# Patient Record
Sex: Female | Born: 1975 | Race: White | Hispanic: No | Marital: Married | Smoking: Never smoker
Health system: Southern US, Community
[De-identification: ages and names within clinical notes are randomized; demographics above are authoritative.]

## PROBLEM LIST (undated history)

## (undated) DIAGNOSIS — F410 Panic disorder [episodic paroxysmal anxiety] without agoraphobia: Secondary | ICD-10-CM

## (undated) DIAGNOSIS — F1010 Alcohol abuse, uncomplicated: Secondary | ICD-10-CM

---

## 2004-02-13 ENCOUNTER — Emergency Department: Payer: Self-pay | Admitting: Emergency Medicine

## 2004-07-28 ENCOUNTER — Emergency Department: Payer: Self-pay | Admitting: Emergency Medicine

## 2004-07-31 ENCOUNTER — Emergency Department: Payer: Self-pay | Admitting: Emergency Medicine

## 2004-09-05 ENCOUNTER — Emergency Department: Payer: Self-pay | Admitting: Emergency Medicine

## 2004-09-26 ENCOUNTER — Emergency Department: Payer: Self-pay | Admitting: Emergency Medicine

## 2005-07-14 ENCOUNTER — Emergency Department: Payer: Self-pay | Admitting: Emergency Medicine

## 2005-10-06 ENCOUNTER — Emergency Department: Payer: Self-pay | Admitting: Emergency Medicine

## 2005-11-21 ENCOUNTER — Emergency Department: Payer: Self-pay | Admitting: Unknown Physician Specialty

## 2007-01-31 ENCOUNTER — Emergency Department: Payer: Self-pay | Admitting: Emergency Medicine

## 2007-02-23 ENCOUNTER — Emergency Department: Payer: Self-pay | Admitting: Emergency Medicine

## 2007-05-03 ENCOUNTER — Emergency Department: Payer: Self-pay | Admitting: Emergency Medicine

## 2007-09-20 ENCOUNTER — Emergency Department: Payer: Self-pay | Admitting: Emergency Medicine

## 2007-12-09 ENCOUNTER — Emergency Department: Payer: Self-pay | Admitting: Emergency Medicine

## 2008-11-19 ENCOUNTER — Inpatient Hospital Stay: Payer: Self-pay | Admitting: Psychiatry

## 2011-02-06 ENCOUNTER — Emergency Department: Payer: Self-pay | Admitting: Emergency Medicine

## 2011-07-27 ENCOUNTER — Emergency Department: Payer: Self-pay | Admitting: Emergency Medicine

## 2011-07-27 LAB — URINALYSIS, COMPLETE
Protein: 100
RBC,UR: 57 /HPF (ref 0–5)
Squamous Epithelial: NONE SEEN

## 2011-07-27 LAB — PREGNANCY, URINE: Pregnancy Test, Urine: NEGATIVE m[IU]/mL

## 2012-11-23 ENCOUNTER — Inpatient Hospital Stay: Payer: Self-pay | Admitting: Psychiatry

## 2012-11-23 LAB — COMPREHENSIVE METABOLIC PANEL
Albumin: 3.9 g/dL (ref 3.4–5.0)
Anion Gap: 9 (ref 7–16)
BUN: 9 mg/dL (ref 7–18)
Bilirubin,Total: 0.3 mg/dL (ref 0.2–1.0)
Calcium, Total: 8.6 mg/dL (ref 8.5–10.1)
Co2: 22 mmol/L (ref 21–32)
Creatinine: 0.82 mg/dL (ref 0.60–1.30)
EGFR (African American): >60
EGFR (Non-African Amer.): >60
Glucose: 118 mg/dL — ABNORMAL HIGH (ref 65–99)
Osmolality: 270 (ref 275–301)
Potassium: 3.3 mmol/L — ABNORMAL LOW (ref 3.5–5.1)
SGOT(AST): 23 U/L (ref 15–37)
Total Protein: 8.1 g/dL (ref 6.4–8.2)

## 2012-11-23 LAB — ETHANOL
Ethanol %: 0.296 % — ABNORMAL HIGH (ref 0.000–0.080)
Ethanol %: 0.106 % — ABNORMAL HIGH (ref 0.000–0.080)
Ethanol: 296 mg/dL
Ethanol: 106 mg/dL

## 2012-11-23 LAB — URINALYSIS, COMPLETE
Bacteria: NONE SEEN
Bilirubin,UR: NEGATIVE
Blood: NEGATIVE
Ketone: NEGATIVE
Leukocyte Esterase: NEGATIVE
Ph: 6 (ref 4.5–8.0)
Protein: NEGATIVE
RBC,UR: <1 /HPF (ref 0–5)
WBC UR: NONE SEEN /HPF (ref 0–5)

## 2012-11-23 LAB — DRUG SCREEN, URINE
Barbiturates, Ur Screen: NEGATIVE (ref ?–200)
Cannabinoid 50 Ng, Ur ~~LOC~~: NEGATIVE (ref ?–50)
Cocaine Metabolite,Ur ~~LOC~~: NEGATIVE (ref ?–300)
MDMA (Ecstasy)Ur Screen: NEGATIVE (ref ?–500)
Methadone, Ur Screen: NEGATIVE (ref ?–300)
Opiate, Ur Screen: NEGATIVE (ref ?–300)
Tricyclic, Ur Screen: NEGATIVE (ref ?–1000)

## 2012-11-23 LAB — CBC
HCT: 32.4 % — ABNORMAL LOW (ref 35.0–47.0)
HGB: 10.5 g/dL — ABNORMAL LOW (ref 12.0–16.0)
MCHC: 32.4 g/dL (ref 32.0–36.0)
Platelet: 361 10*3/uL (ref 150–440)
WBC: 7 10*3/uL (ref 3.6–11.0)

## 2012-11-23 LAB — MAGNESIUM: Magnesium: 1.8 mg/dL

## 2012-12-04 ENCOUNTER — Emergency Department: Payer: Self-pay | Admitting: Emergency Medicine

## 2013-12-01 ENCOUNTER — Emergency Department: Payer: Self-pay | Admitting: Emergency Medicine

## 2013-12-01 LAB — URINALYSIS, COMPLETE
Bacteria: NONE SEEN
Specific Gravity: 1.011 (ref 1.003–1.030)
WBC UR: 921 /HPF (ref 0–5)
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
## 2014-09-20 DIAGNOSIS — Z3202 Encounter for pregnancy test, result negative: Secondary | ICD-10-CM | POA: Insufficient documentation

## 2014-09-20 DIAGNOSIS — F10120 Alcohol abuse with intoxication, uncomplicated: Secondary | ICD-10-CM | POA: Insufficient documentation

## 2014-09-20 DIAGNOSIS — F329 Major depressive disorder, single episode, unspecified: Secondary | ICD-10-CM | POA: Insufficient documentation

## 2014-09-20 DIAGNOSIS — Z8659 Personal history of other mental and behavioral disorders: Secondary | ICD-10-CM | POA: Insufficient documentation

## 2014-09-21 ENCOUNTER — Emergency Department
Admission: EM | Admit: 2014-09-21 | Discharge: 2014-09-21 | Disposition: A | Discharge status: Home / Self Care | Payer: Self-pay | Attending: Emergency Medicine | Admitting: Emergency Medicine

## 2014-09-21 ENCOUNTER — Emergency Department: Payer: Self-pay

## 2014-09-21 DIAGNOSIS — F1020 Alcohol dependence, uncomplicated: Secondary | ICD-10-CM | POA: Diagnosis present

## 2014-09-21 DIAGNOSIS — F329 Major depressive disorder, single episode, unspecified: Secondary | ICD-10-CM

## 2014-09-21 DIAGNOSIS — F10920 Alcohol use, unspecified with intoxication, uncomplicated: Secondary | ICD-10-CM

## 2014-09-21 DIAGNOSIS — F32A Depression, unspecified: Secondary | ICD-10-CM

## 2014-09-21 LAB — URINE DRUG SCREEN, QUALITATIVE (ARMC ONLY)
Amphetamines, Ur Screen: NOT DETECTED
Barbiturates, Ur Screen: NOT DETECTED
Benzodiazepine, Ur Scrn: NOT DETECTED
Cannabinoid 50 Ng, Ur ~~LOC~~: NOT DETECTED
Cocaine Metabolite,Ur ~~LOC~~: NOT DETECTED
MDMA (Ecstasy)Ur Screen: NOT DETECTED
Methadone Scn, Ur: NOT DETECTED
Opiate, Ur Screen: NOT DETECTED
Phencyclidine (PCP) Ur S: NOT DETECTED
Tricyclic, Ur Screen: NOT DETECTED

## 2014-09-21 LAB — COMPREHENSIVE METABOLIC PANEL
ALBUMIN: 4.7 g/dL (ref 3.5–5.0)
ALT: 13 U/L — AB (ref 14–54)
AST: 16 U/L (ref 15–41)
Alkaline Phosphatase: 41 U/L (ref 38–126)
Anion gap: 6 (ref 5–15)
BUN: 9 mg/dL (ref 6–20)
CO2: 25 mmol/L (ref 22–32)
Calcium: 9.6 mg/dL (ref 8.9–10.3)
Chloride: 111 mmol/L (ref 101–111)
Creatinine, Ser: 0.64 mg/dL (ref 0.44–1.00)
GFR calc non Af Amer: >60 mL/min (ref >60)
Glucose, Bld: 108 mg/dL — ABNORMAL HIGH (ref 65–99)
Potassium: 3.8 mmol/L (ref 3.5–5.1)
SODIUM: 142 mmol/L (ref 135–145)
Total Bilirubin: 0.3 mg/dL (ref 0.3–1.2)
Total Protein: 8.4 g/dL — ABNORMAL HIGH (ref 6.5–8.1)

## 2014-09-21 LAB — URINALYSIS COMPLETE WITH MICROSCOPIC (ARMC ONLY)
Bilirubin Urine: NEGATIVE
Glucose, UA: NEGATIVE mg/dL
Hgb urine dipstick: NEGATIVE
Ketones, ur: NEGATIVE mg/dL
Leukocytes, UA: NEGATIVE
Nitrite: NEGATIVE
Protein, ur: NEGATIVE mg/dL
RBC / HPF: NONE SEEN RBC/hpf (ref 0–5)
Specific Gravity, Urine: 1.002 — ABNORMAL LOW (ref 1.005–1.030)
WBC, UA: NONE SEEN WBC/hpf (ref 0–5)
pH: 6 (ref 5.0–8.0)

## 2014-09-21 LAB — CBC
HEMATOCRIT: 33.3 % — AB (ref 35.0–47.0)
Hemoglobin: 10.1 g/dL — ABNORMAL LOW (ref 12.0–16.0)
MCH: 21.2 pg — AB (ref 26.0–34.0)
MCHC: 30.4 g/dL — AB (ref 32.0–36.0)
MCV: 69.7 fL — ABNORMAL LOW (ref 80.0–100.0)
Platelets: 371 10*3/uL (ref 150–440)
RBC: 4.79 MIL/uL (ref 3.80–5.20)
RDW: 19.1 % — ABNORMAL HIGH (ref 11.5–14.5)
WBC: 6.5 10*3/uL (ref 3.6–11.0)

## 2014-09-21 LAB — ETHANOL: ALCOHOL ETHYL (B): 308 mg/dL — AB (ref <5)

## 2014-09-21 LAB — SALICYLATE LEVEL: Salicylate Lvl: <4 mg/dL (ref 2.8–30.0)

## 2014-09-21 LAB — POCT PREGNANCY, URINE: Preg Test, Ur: NEGATIVE

## 2014-09-21 LAB — ACETAMINOPHEN LEVEL

## 2014-09-21 MED ORDER — THIAMINE HCL 100 MG/ML IJ SOLN
Freq: Once | INTRAVENOUS | Status: AC
  Administered 2014-09-21: 04:00:00 via INTRAVENOUS
  Filled 2014-09-21: qty 1000

## 2014-09-21 MED ORDER — SODIUM CHLORIDE 0.9 % IV BOLUS (SEPSIS)
1000.0000 mL | Freq: Once | INTRAVENOUS | Status: AC
  Administered 2014-09-21: 1000 mL via INTRAVENOUS

## 2014-09-21 NOTE — ED Provider Notes (Signed)
[facility] Emergency Department Provider Note  ____________________________________________  Time seen: Approximately 3:17 AM  I have reviewed the triage vital signs and the nursing notes.   HISTORY  Chief Complaint Alcohol Problem  History limited by alcohol intoxication  HPI Shannon Duffy is a 39 y.o. female who presents under IVC per police for alcohol intoxication, homicidal ideation and suicidal ideation. Patient has a history of bipolar disorder and self-injurious behavior.Rest of history limited by patient's intoxication.   Past medical history Bipolar disorder Self injury   There are no active problems to display for this patient.   No past surgical history on file.  No current outpatient prescriptions on file.  Allergies Review of patient's allergies indicates no known allergies.  No family history on file.  Social History History  Substance Use Topics  . Smoking status: Not on file  . Smokeless tobacco: Not on file  . Alcohol Use: Not on file  Positive for EtOH use  Review of Systems Constitutional: No fever/chills Eyes: No visual changes. ENT: No sore throat. Cardiovascular: Denies chest pain. Respiratory: Denies shortness of breath. Gastrointestinal: No abdominal pain.  No nausea, no vomiting.  No diarrhea.  No constipation. Genitourinary: Negative for dysuria. Musculoskeletal: Negative for back pain. Skin: Negative for rash. Neurological: Negative for headaches, focal weakness or numbness. Psychiatric:Positive for HI/SI.  10-point ROS otherwise negative.  ____________________________________________   PHYSICAL EXAM:  VITAL SIGNS: ED Triage Vitals  Enc Vitals Group     BP 09/21/14 0006 142/93 mmHg     Pulse Rate 09/21/14 0006 100     Resp 09/21/14 0006 18     Temp 09/21/14 0006 98 F (36.7 C)     Temp Source 09/21/14 0006 Oral     SpO2 09/21/14 0006 100 %     Weight 09/21/14 0006 145 lb (65.772 kg)     Height 09/21/14 0006  (1.651 m)     Head Cir --      Peak Flow --      Pain Score --      Pain Loc --      Pain Edu? --      Excl. in GC? --     Constitutional: Sleeping, intoxicated. Eyes: Conjunctivae are normal. PERRL. EOMI. Head: Right forehead hematoma noted. Nose: No congestion/rhinnorhea. Mouth/Throat: Mucous membranes are moist.  Oropharynx non-erythematous. Neck: No stridor. No cervical spine tenderness to palpation. No step-offs or deformities palpated. Cardiovascular: Normal rate, regular rhythm. Grossly normal heart sounds.  Good peripheral circulation. Respiratory: Normal respiratory effort.  No retractions. Lungs CTAB. Gastrointestinal: Soft and nontender. No distention. No abdominal bruits. No CVA tenderness. Musculoskeletal: No lower extremity tenderness nor edema.  No joint effusions. Neurologic:  Slurred speech and language. No gross focal neurologic deficits are appreciated.  Skin:  Skin is warm, dry and intact. No rash noted. Psychiatric: Mood and affect are flat. Speech and behavior are depressed.  ____________________________________________   LABS (all labs ordered are listed, but only abnormal results are displayed)  Labs Reviewed  ACETAMINOPHEN LEVEL - Abnormal; Notable for the following:    Acetaminophen (Tylenol), Serum <10 (*)    All other components within normal limits  CBC - Abnormal; Notable for the following:    Hemoglobin 10.1 (*)    HCT 33.3 (*)    MCV 69.7 (*)    MCH 21.2 (*)    MCHC 30.4 (*)    RDW 19.1 (*)    All other components within normal limits  COMPREHENSIVE METABOLIC PANEL - Abnormal; Notable for the following:    Glucose, Bld 108 (*)    Total Protein 8.4 (*)    ALT 13 (*)    All other components within normal limits  ETHANOL - Abnormal; Notable for the following:    Alcohol, Ethyl (B) 308 (*)    All other components within normal limits  URINALYSIS COMPLETEWITH MICROSCOPIC ([facility] ONLY) - Abnormal; Notable for the  following:    Color, Urine COLORLESS (*)    APPearance CLEAR (*)    Specific Gravity, Urine 1.002 (*)    Bacteria, UA RARE (*)    Squamous Epithelial / LPF 0-5 (*)    All other components within normal limits  SALICYLATE LEVEL  URINE DRUG SCREEN, QUALITATIVE ([facility] ONLY)  POC URINE PREG, ED  POCT PREGNANCY, URINE   ____________________________________________  EKG  None ____________________________________________  RADIOLOGY  CT head without contrast interpreted per Dr. Clovis RileyMitchell: Normal brain. ____________________________________________   PROCEDURES  Procedure(s) performed: None  Critical Care performed: No  ____________________________________________   INITIAL IMPRESSION / ASSESSMENT AND PLAN / ED COURSE  Pertinent labs & imaging results that were available during my care of the patient were reviewed by me and considered in my medical decision making (see chart for details).  39 year old female who presents under IVC who is intoxicated, voicing SI/HI with a prior history of bipolar disorder and self-injurious behavior. Patient has a hematoma to her right forehead noted on exam; unknown if patient fell. Will obtain CT head, maintain IVC, IV fluid resuscitation, consult TTS and psychiatry to that patient in the emergency department.  ----------------------------------------- 7:17 AM on 09/21/2014 -----------------------------------------  Patient resting in no acute distress. Pending psychiatry evaluation this morning. ____________________________________________   FINAL CLINICAL IMPRESSION(S) / ED DIAGNOSES  Final diagnoses:  Alcohol intoxication, uncomplicated  Depression      Irean HongJade J Syesha Thaw, MD 09/21/14 [phone]

## 2014-09-21 NOTE — ED Notes (Signed)
Pt to be discharged per Dr. Jenne CampusMcQueen

## 2014-09-21 NOTE — ED Notes (Signed)
BEHAVIORAL HEALTH ROUNDING Patient sleeping: No. Patient alert and oriented: yes Behavior appropriate: Yes.  ; If no, describe:  Nutrition and fluids offered: Yes  Toileting and hygiene offered: Yes  Sitter present: no Law enforcement present: Yes  

## 2014-09-21 NOTE — ED Notes (Signed)
.  [facility] 

## 2014-09-21 NOTE — ED Notes (Signed)
Sitting up in bed watching TV 

## 2014-09-21 NOTE — ED Provider Notes (Signed)
[facility] and [facility]. Patient will follow-up with [facility] turn as needed  Arnaldo NatalPaul F Jonte Wollam, MD 09/21/14 2036

## 2014-09-21 NOTE — ED Notes (Signed)
Pt sitting up in bed watching tv. 

## 2014-09-21 NOTE — ED Notes (Signed)
BEHAVIORAL HEALTH ROUNDING  Patient sleeping: No.  Patient alert and oriented: yes  Behavior appropriate: Yes. ; If no, describe:  Nutrition and fluids offered: Yes  Toileting and hygiene offered: Yes  Sitter present: not applicable  Law enforcement present: Yes [facility]  

## 2014-09-21 NOTE — [facility] Counselor (Signed)
Pt. has been accepted to [facility]. Accepting physician is Dr. Les Pouarlton. Call report to [phone]. Representative was [employer]. Discussed with Psych MD, Dr. Jenne CampusMcQueen and states, she no longer meet criteria for inpatient and IVC. Writer called [facility] back and updated them, that the patient will not be coming to their facility.  Writer provided the pt. with information and instructions on how to access Out Pt. Mental Health Treatment ([facility] and [facility]).

## 2014-09-21 NOTE — ED Notes (Signed)
Patient sitting up watching TV.

## 2014-09-21 NOTE — Consult Note (Signed)
[facility] Consult   Reason for Consult:  Alcohol intoxication with SI yesterday.  Referring Physician:  ED Physician at [facility]  Patient Identification: Shannon Duffy MRN:  [id number] Principal Diagnosis: Alcohol use disorder, moderate, dependence Diagnosis:   Patient Active Problem List   Diagnosis Date Noted  . Alcohol use disorder, moderate, dependence [F10.20] 09/21/2014    Total Time spent with patient: 1.5 hours  Subjective:   Shannon Duffy is a 39 y.o. female patient admitted with alcohol use disorder, moderate who comes to the ED for evaluation of telling someone on the phone she wanted to harm herself when she was intoxicated last evening. The pt stated she does not recall saying this but would not be surprised if she did.   She discussed being dependent on alcohol in the past for about 10 years. She was able to stop using ethanol regularly, and abstained from ethanol use for about 7 months. This past January, the pt had one drink and then had one drink in February 2016. From there alcohol consumption increased. She is currently using ethanol 2 times per week and drinks 1-3 40 oz beers at one sitting. She shared 2 months ago, there was increased stress and due to this, alcohol consumption increased. She shared alcohol helps to decrease stress.   She noted stress includes her fiance's 54yo son who lives in the home with them.  She also shared her 3 children are with her this summer and that is causing some increased stress as well.    The pt denies SI, HI and AVH currently and is remorseful of her statements from last night. She wants to stop using ethanol and she shared she will attempt to do so. Her fiance is setting up [employer] for he and the pt.  She denies having weapons at home.  Pt denies symptoms consistent with depression, sadness, anxiety, mania & OCD. She also denies hopelessness, helplessness and worthlessness. Protective factors against  suicide include: family, no previous suicide attempts, strong support system and religion. Pt verbally contracts for safety.   Pt provided me permission to speak with her fiance Mila Merry at [phone]. He shared the pt has been under increased stress at home. He shared she started consuming alcohol again after 7 months of sobriety. The amount of alcohol is gradually increasing and the pt's fiance has been trying to get her to cut back. He notes last night she "overdid it" and made some statements she should not have made. He visited her this morning in the ED and noted she is doing much better and feels she is safe at home. He notes she is not a danger to herself or others. He is setting up [facility] and noted this will start next week. He feels safe with the pt at home.   HPI Elements:   Location:  at home. Quality:  moderate. Severity:  moderate. Timing:  24 hours. Duration:  6 months. Context:  due to increased stress.  Past Medical History: No past medical history on file. No past surgical history on file. Family History: No family history on file.  Psychiatric history in maternal and paternal family but pt is uncertain of their diagnoses.  Sister: substance abuse, currently in jail.   Social History:  History  Alcohol Use: Not on file     History  Drug Use Not on file    History   Social History  . Marital Status: Single    Spouse Name:  N/A  . Number of Children: N/A  . Years of Education: N/A   Social History Main Topics  . Smoking status: Not on file  . Smokeless tobacco: Not on file  . Alcohol Use: Not on file  . Drug Use: Not on file  . Sexual Activity: Not on file   Other Topics Concern  . Not on file   Social History Narrative  . No narrative on file   Pt denies having a psychiatrist currently.  Has not attended AA or other substance abuse treatment.  Has tried Zoloft in the past.   Additional Social History: Lives with her fiance, his 62yo  son and her 3 children for the summer.   Allergies:  No Known Allergies  Labs:  Results for orders placed or performed during the hospital encounter of 09/21/14 (from the past 48 hour(s))  Acetaminophen level     Status: Abnormal   Collection Time: 09/21/14 12:17 AM  Result Value Ref Range   Acetaminophen (Tylenol), Serum <10 (L) 10 - 30 ug/mL    Comment:        THERAPEUTIC CONCENTRATIONS VARY SIGNIFICANTLY. A RANGE OF 10-30 ug/mL MAY BE AN EFFECTIVE CONCENTRATION FOR MANY PATIENTS. HOWEVER, SOME ARE BEST TREATED AT CONCENTRATIONS OUTSIDE THIS RANGE. ACETAMINOPHEN CONCENTRATIONS >150 ug/mL AT 4 HOURS AFTER INGESTION AND >50 ug/mL AT 12 HOURS AFTER INGESTION ARE OFTEN ASSOCIATED WITH TOXIC REACTIONS.   CBC     Status: Abnormal   Collection Time: 09/21/14 12:17 AM  Result Value Ref Range   WBC 6.5 3.6 - 11.0 K/uL   RBC 4.79 3.80 - 5.20 MIL/uL   Hemoglobin 10.1 (L) 12.0 - 16.0 g/dL   HCT 33.3 (L) 35.0 - 47.0 %   MCV 69.7 (L) 80.0 - 100.0 fL   MCH 21.2 (L) 26.0 - 34.0 pg   MCHC 30.4 (L) 32.0 - 36.0 g/dL   RDW 19.1 (H) 11.5 - 14.5 %   Platelets 371 150 - 440 K/uL  Comprehensive metabolic panel     Status: Abnormal   Collection Time: 09/21/14 12:17 AM  Result Value Ref Range   Sodium 142 135 - 145 mmol/L   Potassium 3.8 3.5 - 5.1 mmol/L   Chloride 111 101 - 111 mmol/L   CO2 25 22 - 32 mmol/L   Glucose, Bld 108 (H) 65 - 99 mg/dL   BUN 9 6 - 20 mg/dL   Creatinine, Ser 0.64 0.44 - 1.00 mg/dL   Calcium 9.6 8.9 - 10.3 mg/dL   Total Protein 8.4 (H) 6.5 - 8.1 g/dL   Albumin 4.7 3.5 - 5.0 g/dL   AST 16 15 - 41 U/L   ALT 13 (L) 14 - 54 U/L   Alkaline Phosphatase 41 38 - 126 U/L   Total Bilirubin 0.3 0.3 - 1.2 mg/dL   GFR calc non Af Amer >60 >60 mL/min   GFR calc Af Amer >60 >60 mL/min    Comment: (NOTE) The eGFR has been calculated using the CKD EPI equation. This calculation has not been validated in all clinical situations. eGFR's persistently <60 mL/min signify  possible Chronic Kidney Disease.    Anion gap 6 5 - 15  Ethanol (ETOH)     Status: Abnormal   Collection Time: 09/21/14 12:17 AM  Result Value Ref Range   Alcohol, Ethyl (B) 308 (HH) <5 mg/dL    Comment: RESULTS VERIFIED BY REPEAT TESTING CRITICAL RESULT CALLED TO, READ BACK BY AND VERIFIED WITH APRIL BRUMGARD AT 0106 09/21/14.PMH  LOWEST DETECTABLE LIMIT FOR SERUM ALCOHOL IS 5 mg/dL FOR MEDICAL PURPOSES ONLY   Salicylate level     Status: None   Collection Time: 09/21/14 12:17 AM  Result Value Ref Range   Salicylate Lvl <2.7 2.8 - 30.0 mg/dL  Urinalysis complete, with microscopic ([facility] only)     Status: Abnormal   Collection Time: 09/21/14 12:18 AM  Result Value Ref Range   Color, Urine COLORLESS (A) YELLOW   APPearance CLEAR (A) CLEAR   Glucose, UA NEGATIVE NEGATIVE mg/dL   Bilirubin Urine NEGATIVE NEGATIVE   Ketones, ur NEGATIVE NEGATIVE mg/dL   Specific Gravity, Urine 1.002 (L) 1.005 - 1.030   Hgb urine dipstick NEGATIVE NEGATIVE   pH 6.0 5.0 - 8.0   Protein, ur NEGATIVE NEGATIVE mg/dL   Nitrite NEGATIVE NEGATIVE   Leukocytes, UA NEGATIVE NEGATIVE   RBC / HPF NONE SEEN 0 - 5 RBC/hpf   WBC, UA NONE SEEN 0 - 5 WBC/hpf   Bacteria, UA RARE (A) NONE SEEN   Squamous Epithelial / LPF 0-5 (A) NONE SEEN  Urine Drug Screen, Qualitative ([facility] only)     Status: None   Collection Time: 09/21/14 12:18 AM  Result Value Ref Range   Tricyclic, Ur Screen NONE DETECTED NONE DETECTED   Amphetamines, Ur Screen NONE DETECTED NONE DETECTED   MDMA (Ecstasy)Ur Screen NONE DETECTED NONE DETECTED   Cocaine Metabolite,Ur [place] NONE DETECTED NONE DETECTED   Opiate, Ur Screen NONE DETECTED NONE DETECTED   Phencyclidine (PCP) Ur S NONE DETECTED NONE DETECTED   Cannabinoid 50 Ng, Ur [place] NONE DETECTED NONE DETECTED   Barbiturates, Ur Screen NONE DETECTED NONE DETECTED   Benzodiazepine, Ur Scrn NONE DETECTED NONE DETECTED   Methadone Scn, Ur NONE DETECTED NONE DETECTED    Comment: (NOTE) 782   Tricyclics, urine               Cutoff 1000 ng/mL 200  Amphetamines, urine             Cutoff 1000 ng/mL 300  MDMA (Ecstasy), urine           Cutoff 500 ng/mL 400  Cocaine Metabolite, urine       Cutoff 300 ng/mL 500  Opiate, urine                   Cutoff 300 ng/mL 600  Phencyclidine (PCP), urine      Cutoff 25 ng/mL 700  Cannabinoid, urine              Cutoff 50 ng/mL 800  Barbiturates, urine             Cutoff 200 ng/mL 900  Benzodiazepine, urine           Cutoff 200 ng/mL 1000 Methadone, urine                Cutoff 300 ng/mL 1100 1200 The urine drug screen provides only a preliminary, unconfirmed 1300 analytical test result and should not be used for non-medical 1400 purposes. Clinical consideration and professional judgment should 1500 be applied to any positive drug screen result due to possible 1600 interfering substances. A more specific alternate chemical method 1700 must be used in order to obtain a confirmed analytical result.  1800 Gas chromato graphy / mass spectrometry (GC/MS) is the preferred 1900 confirmatory method.   Pregnancy, urine POC     Status: None   Collection Time: 09/21/14 12:33 AM  Result Value Ref Range   Preg Test, Ur  NEGATIVE NEGATIVE    Comment:        THE SENSITIVITY OF THIS METHODOLOGY IS >24 mIU/mL     Vitals: Blood pressure 115/71, pulse 79, temperature 98.6 F (37 C), temperature source Oral, resp. rate 20, height _0  (1.651 m), weight 65.772 kg (145 lb), last menstrual period 08/29/2014, SpO2 95 %.  Risk to Self: Suicidal Ideation: Yes-Currently Present Suicidal Intent: Yes-Currently Present Is patient at risk for suicide?: Yes Suicidal Plan?: Yes-Currently Present Access to Means: No What has been your use of drugs/alcohol within the last 12 months?: alcohol; drank 3--40oz beers; BAC: 308 How many times?: 3 Other Self Harm Risks: h/o cutting Triggers for Past Attempts: Spouse contact, Family contact Intentional Self Injurious  Behavior: Cutting Comment - Self Injurious Behavior: per friend; "she has been cutting for a long time." Risk to Others: Homicidal Ideation: No Thoughts of Harm to Others: No Current Homicidal Intent: No Current Homicidal Plan: No Access to Homicidal Means: No Identified Victim: none History of harm to others?: No Assessment of Violence: On admission Violent Behavior Description: aggressive when intoxicated Does patient have access to weapons?: No Criminal Charges Pending?: No Does patient have a court date: No Prior Inpatient Therapy: Prior Inpatient Therapy: Yes Prior Therapy Dates: unknown Prior Therapy Facilty/Provider(s): [facility] Reason for Treatment: depression; alcohol Prior Outpatient Therapy: Prior Outpatient Therapy: No Does patient have an ACCT team?: No Does patient have Intensive In-House Services?  : No Does patient have [facility] services? : No Does patient have [facility] services?: No  No current facility-administered medications for this encounter.   No current outpatient prescriptions on file.    Musculoskeletal: Strength & Muscle Tone: within normal limits Gait & Station: not assessed Patient leans: N/A  Psychiatric Specialty Exam: Physical Exam  Review of Systems  Constitutional: Negative.   HENT: Negative.   Eyes: Negative.   Respiratory: Negative.   Cardiovascular: Negative.   Gastrointestinal: Negative.   Genitourinary: Negative.   Musculoskeletal: Negative.   Skin: Negative.   Neurological: Negative.   Endo/Heme/Allergies: Negative.   Psychiatric/Behavioral: Negative.     Blood pressure 115/71, pulse 79, temperature 98.6 F (37 C), temperature source Oral, resp. rate 20, height _1  (1.651 m), weight 65.772 kg (145 lb), last menstrual period 08/29/2014, SpO2 95 %.Body mass index is 24.13 kg/(m^2).  General Appearance: Casual, Neat and Well Groomed  Eye Contact::  Good  Speech:  Clear and Coherent  Volume:  Normal  Mood:  Euthymic  Affect:   Appropriate  Thought Process:  Coherent, Goal Directed, Intact, Linear and Logical  Orientation:  Full (Time, Place, and Person)  Thought Content:  Negative  Suicidal Thoughts:  No  Homicidal Thoughts:  No  Memory:  Immediate;   Good Recent;   Fair Remote;   Good  Judgement:  Fair  Insight:  Fair  Psychomotor Activity:  Normal  Concentration:  Good  Recall:  Good  Fund of Knowledge:Good  Language: Good  Akathisia:  NA  Handed:  Right  AIMS (if indicated):     Assets:  Communication Skills Desire for Improvement Financial Resources/Insurance Housing Intimacy Physical Health Resilience Social Support Talents/Skills Transportation Vocational/Educational  ADL's:  Intact  Cognition: WNL  Sleep:      Medical Decision Making: Review or order clinical lab tests (1) and Established Problem, Worsening (2)  Treatment Plan Summary: 1. Discharge home.  2. No medication changes necessary at this time.  3. Pt will follow-up with [facility] as an output.  4. Recommended outpatient  substance abuse treatment including AA.  5. IVC discontinued.   Plan:  No evidence of imminent risk to self or others at present.   Patient does not meet criteria for psychiatric inpatient admission. Supportive therapy provided about ongoing stressors. Discussed crisis plan, support from social network, calling 911, coming to the Emergency Department, and calling Suicide Hotline. Disposition: Home  Donita Brooks, M.D. Attending Physician [place] 09/21/2014 5:01 PM

## 2014-09-21 NOTE — ED Notes (Signed)
Patient eating and watching tv

## 2014-09-21 NOTE — ED Notes (Signed)
Pt brought into ED BHU via sally port and wanded with metal detector for safety by [employer] officer and ED Alanda Amassech Toni. Patient oriented to unit/care area: Pt informed of unit policies and procedures.  Informed that, for their safety, care areas are designed for safety and monitored by security cameras at all times; and visiting hours explained to patient. Patient verbalizes understanding, and verbal contract for safety obtained.Pt shown to her room.   ENVIRONMENTAL ASSESSMENT Potentially harmful objects out of patient reach: Yes.   Personal belongings secured: Yes.   Patient dressed in hospital provided attire only: Yes.   Plastic bags out of patient reach: Yes.   Patient care equipment  removed: Yes.   Equipment and supplies removed: Yes.   Potentially toxic materials out of patient reach: Yes.   Sharps container removed or out of patient reach: Yes.    BEHAVIORAL HEALTH ROUNDING Patient sleeping: No. Patient alert and oriented: yes Behavior appropriate: Yes.   Nutrition and fluids offered: Yes  Toileting and hygiene offered: Yes  Sitter present: q15 min observations and security camera monitoring Law enforcement present: Yes [employer]  Patient assigned to appropriate care area. Patient oriented to unit/care area: Informed that, for their safety, care areas are designed for safety and monitored by security cameras at all times; and visiting hours explained to patient. Patient verbalizes understanding, and verbal contract for safety obtained.

## 2014-09-21 NOTE — Discharge Instructions (Signed)
Alcohol Use Disorder °Alcohol use disorder is a mental disorder. It is not a one-time incident of heavy drinking. Alcohol use disorder is the excessive and uncontrollable use of alcohol over time that leads to problems with functioning in one or more areas of daily living. People with this disorder risk harming themselves and others when they drink to excess. Alcohol use disorder also can cause other mental disorders, such as mood and anxiety disorders, and serious physical problems. People with alcohol use disorder often misuse other drugs.  °Alcohol use disorder is common and widespread. Some people with this disorder drink alcohol to cope with or escape from negative life events. Others drink to relieve chronic pain or symptoms of mental illness. People with a family history of alcohol use disorder are at higher risk of losing control and using alcohol to excess.  °SYMPTOMS  °Signs and symptoms of alcohol use disorder may include the following:  °· Consumption of alcohol in larger amounts or over a longer period of time than intended. °· Multiple unsuccessful attempts to cut down or control alcohol use.   °· A great deal of time spent obtaining alcohol, using alcohol, or recovering from the effects of alcohol (hangover). °· A strong desire or urge to use alcohol (cravings).   °· Continued use of alcohol despite problems at work, school, or home because of alcohol use.   °· Continued use of alcohol despite problems in relationships because of alcohol use. °· Continued use of alcohol in situations when it is physically hazardous, such as driving a car. °· Continued use of alcohol despite awareness of a physical or psychological problem that is likely related to alcohol use. Physical problems related to alcohol use can involve the brain, heart, liver, stomach, and intestines. Psychological problems related to alcohol use include intoxication, depression, anxiety, psychosis, delirium, and dementia.   °· The need for  increased amounts of alcohol to achieve the same desired effect, or a decreased effect from the consumption of the same amount of alcohol (tolerance). °· Withdrawal symptoms upon reducing or stopping alcohol use, or alcohol use to reduce or avoid withdrawal symptoms. Withdrawal symptoms include: °¨ Racing heart. °¨ Hand tremor. °¨ Difficulty sleeping. °¨ Nausea. °¨ Vomiting. °¨ Hallucinations. °¨ Restlessness. °¨ Seizures. °DIAGNOSIS °Alcohol use disorder is diagnosed through an assessment by your health care provider. Your health care provider may start by asking three or four questions to screen for excessive or problematic alcohol use. To confirm a diagnosis of alcohol use disorder, at least two symptoms must be present within a 12-month period. The severity of alcohol use disorder depends on the number of symptoms: °· Mild--two or three. °· Moderate--four or five. °· Severe--six or more. °Your health care provider may perform a physical exam or use results from lab tests to see if you have physical problems resulting from alcohol use. Your health care provider may refer you to a mental health professional for evaluation. °TREATMENT  °Some people with alcohol use disorder are able to reduce their alcohol use to low-risk levels. Some people with alcohol use disorder need to quit drinking alcohol. When necessary, mental health professionals with specialized training in substance use treatment can help. Your health care provider can help you decide how severe your alcohol use disorder is and what type of treatment you need. The following forms of treatment are available:  °· Detoxification. Detoxification involves the use of prescription medicines to prevent alcohol withdrawal symptoms in the first week after quitting. This is important for people with a history of symptoms   of withdrawal and for heavy drinkers who are likely to have withdrawal symptoms. Alcohol withdrawal can be dangerous and, in severe cases, cause  death. Detoxification is usually provided in a hospital or in-patient substance use treatment facility.  Counseling or talk therapy. Talk therapy is provided by substance use treatment counselors. It addresses the reasons people use alcohol and ways to keep them from drinking again. The goals of talk therapy are to help people with alcohol use disorder find healthy activities and ways to cope with life stress, to identify and avoid triggers for alcohol use, and to handle cravings, which can cause relapse.  Medicines.Different medicines can help treat alcohol use disorder through the following actions:  Decrease alcohol cravings.  Decrease the positive reward response felt from alcohol use.  Produce an uncomfortable physical reaction when alcohol is used (aversion therapy).  Support groups. Support groups are run by people who have quit drinking. They provide emotional support, advice, and guidance. These forms of treatment are often combined. Some people with alcohol use disorder benefit from intensive combination treatment provided by specialized substance use treatment centers. Both inpatient and outpatient treatment programs are available. Document Released: 04/15/2004 Document Revised: 07/23/2013 Document Reviewed: 06/15/2012 [facility] Patient Information 2015 [employer], [place]. This information is not intended to replace advice given to you by your health care provider. Make sure you discuss any questions you have with your health care provider.   Follow up with [facility] instructions on the other sheet of paper.   Return as needed.
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
## 2014-09-21 NOTE — ED Notes (Signed)
Patient transported to CT 

## 2014-09-21 NOTE — ED Notes (Signed)
Patient laying in bed, took in dinner sat up to eat and ask the time.

## 2014-09-21 NOTE — ED Notes (Signed)
Sitting up watching TV 

## 2014-09-21 NOTE — [facility] Counselor (Signed)
Referral Information [facility] to [facility] [phone]) for possible placement in their dual dx program.

## 2014-09-21 NOTE — ED Notes (Signed)

## 2014-09-21 NOTE — ED Notes (Signed)
BEHAVIORAL HEALTH ROUNDING Patient sleeping: Yes.   Patient alert and oriented: not applicable Behavior appropriate: Yes.  ; If no, describe:   Nutrition and fluids offered: No Toileting and hygiene offered: No Sitter present: no Law enforcement present: Yes  and ODS  ENVIRONMENTAL ASSESSMENT Potentially harmful objects out of patient reach: Yes.   Personal belongings secured: Yes.   Patient dressed in hospital provided attire only: Yes.   Plastic bags out of patient reach: Yes.   Patient care equipment (cords, cables, call bells, lines, and drains) shortened, removed, or accounted for: Yes.   Equipment and supplies removed from bottom of stretcher: Yes.   Potentially toxic materials out of patient reach: Yes.   Sharps container removed or out of patient reach: Yes.    

## 2014-09-21 NOTE — ED Notes (Addendum)
Pt accepted at [facility] per calvin

## 2014-09-21 NOTE — ED Notes (Signed)
Fiance left patient (visiting hours over).

## 2014-09-21 NOTE — ED Notes (Signed)
Per police present, pt called mobile crisis. Pt states "i have a problem with alcohol". Pt states "i feel like i'm gonna hurt myself all the time", "i feel like i'm gonna kill someone, if i don't kill someone, i'm gonna kill myself first".

## 2014-09-21 NOTE — ED Notes (Signed)
Fiance present to visit patient.

## 2014-09-21 NOTE — ED Notes (Signed)
Resting watching TV

## 2014-09-21 NOTE — ED Notes (Signed)
Critical etoh called from lab of 304, dr. Dolores FrameSung notified, charge rn notified.

## 2014-09-21 NOTE — ED Notes (Signed)
BEHAVIORAL HEALTH ROUNDING Patient sleeping: Yes.   Patient alert and oriented: no Behavior appropriate: Yes.  ; If no, describe:   Nutrition and fluids offered: No Toileting and hygiene offered: No Sitter present: no Law enforcement present: Yes  and ODS 

## 2014-09-21 NOTE — ED Notes (Signed)
Patient transported back to room.

## 2014-09-21 NOTE — ED Notes (Signed)
BEHAVIORAL HEALTH ROUNDING Patient sleeping: Yes.   Patient alert and oriented: not applicable Behavior appropriate: Yes.  ; If no, describe:   Nutrition and fluids offered: No Toileting and hygiene offered: No Sitter present: no Law enforcement present: Yes  and ODS    

## 2014-09-21 NOTE — ED Notes (Signed)
BEHAVIORAL HEALTH ROUNDING Patient sleeping: No. Patient alert and oriented: yes Behavior appropriate: Yes.  ; If no, describe:  Nutrition and fluids offered: No Toileting and hygiene offered: Yes  Sitter present: yes Law enforcement present: Yes  

## 2014-09-21 NOTE — ED Notes (Signed)
Patient resting watching TV.

## 2014-09-21 NOTE — ED Notes (Signed)
Patient retrieved remote to change TV. Watching TV and resting

## 2014-09-21 NOTE — ED Notes (Signed)
IVC 

## 2014-12-05 ENCOUNTER — Encounter: Payer: Self-pay | Admitting: Emergency Medicine

## 2014-12-05 ENCOUNTER — Emergency Department
Admission: EM | Admit: 2014-12-05 | Discharge: 2014-12-05 | Disposition: A | Discharge status: Home / Self Care | Payer: Self-pay | Attending: Emergency Medicine | Admitting: Emergency Medicine

## 2014-12-05 ENCOUNTER — Emergency Department: Payer: Self-pay

## 2014-12-05 DIAGNOSIS — F419 Anxiety disorder, unspecified: Secondary | ICD-10-CM

## 2014-12-05 DIAGNOSIS — R079 Chest pain, unspecified: Secondary | ICD-10-CM

## 2014-12-05 DIAGNOSIS — F329 Major depressive disorder, single episode, unspecified: Secondary | ICD-10-CM | POA: Insufficient documentation

## 2014-12-05 HISTORY — DX: Panic disorder (episodic paroxysmal anxiety): F41.0

## 2014-12-05 LAB — BASIC METABOLIC PANEL
ANION GAP: 7 (ref 5–15)
BUN: 17 mg/dL (ref 6–20)
CHLORIDE: 105 mmol/L (ref 101–111)
CO2: 24 mmol/L (ref 22–32)
Calcium: 9.4 mg/dL (ref 8.9–10.3)
Creatinine, Ser: 0.59 mg/dL (ref 0.44–1.00)
GFR calc Af Amer: >60 mL/min (ref >60)
GLUCOSE: 101 mg/dL — AB (ref 65–99)
POTASSIUM: 4.1 mmol/L (ref 3.5–5.1)
Sodium: 136 mmol/L (ref 135–145)

## 2014-12-05 LAB — CBC
HEMATOCRIT: 29.8 % — AB (ref 35.0–47.0)
HEMOGLOBIN: 9 g/dL — AB (ref 12.0–16.0)
MCH: 20.9 pg — ABNORMAL LOW (ref 26.0–34.0)
MCHC: 30.3 g/dL — AB (ref 32.0–36.0)
MCV: 69 fL — AB (ref 80.0–100.0)
Platelets: 308 10*3/uL (ref 150–440)
RBC: 4.32 MIL/uL (ref 3.80–5.20)
RDW: 18.4 % — ABNORMAL HIGH (ref 11.5–14.5)
WBC: 6.6 10*3/uL (ref 3.6–11.0)

## 2014-12-05 LAB — TROPONIN I: Troponin I: <0.03 ng/mL (ref <0.031)

## 2014-12-05 NOTE — ED Provider Notes (Signed)
[facility] Emergency Department Provider Note  ____________________________________________  Time seen: 8:20 PM  I have reviewed the triage vital signs and the nursing notes.   HISTORY  Chief Complaint Chest Pain    HPI Shannon Duffy is a 39 y.o. female who complains of midsternal chest pressure and palpitations since 1:00 PM today. It was gradual in onset. She has a history of bipolar disease, PTSD, and alcoholism. The alcoholism has been in remission for 1.5 years, however over the past few months she is started drinking occasionally again. She reports that motion drinks 2 beers once a week. She also reports increased stress recently and feeling very anxious, and her spouse is working a lot so she has a lot of time alone by herself. As she started to develop some palpitations and anxiety and chest pressure, she had noted talk to and was alone with her thoughts and felt like it just snowballed. The chest pain is nonradiating, nonexertional, not pleuritic. No associated nausea vomiting or diaphoresis. No shortness of breath. Constant for the past 7 hours     Past Medical History  Diagnosis Date  . Panic      Patient Active Problem List   Diagnosis Date Noted  . Alcohol use disorder, moderate, dependence 09/21/2014     No past surgical history on file. Bilateral tubal ligation  No current outpatient prescriptions on file. None. Previously took Zoloft but has weaned herself off of it.  Allergies Review of patient's allergies indicates no known allergies.   No family history on file.  Social History Social History  Substance Use Topics  . Smoking status: Never Smoker   . Smokeless tobacco: None  . Alcohol Use: Yes    Review of Systems  Constitutional:   No fever or chills. No weight changes Eyes:   No blurry vision or double vision.  ENT:   No sore throat. Cardiovascular:   Positive as abovechest pain. Respiratory:   No dyspnea or  cough. Gastrointestinal:   Negative for abdominal pain, vomiting and diarrhea.  No BRBPR or melena. Genitourinary:   Negative for dysuria, urinary retention, bloody urine, or difficulty urinating. Musculoskeletal:   Negative for back pain. No joint swelling or pain. Skin:   Negative for rash. Neurological:   Negative for headaches, focal weakness or numbness. Psychiatric:  Positive anxiety.rapid mood swings. No SI or HI or hallucinations.   Endocrine:  No hot/cold intolerance, changes in energy, or sleep difficulty.  10-point ROS otherwise negative.  ____________________________________________   PHYSICAL EXAM:  VITAL SIGNS: ED Triage Vitals  Enc Vitals Group     BP 12/05/14 1731 151/100 mmHg     Pulse Rate 12/05/14 1731 85     Resp 12/05/14 1731 20     Temp 12/05/14 1731 97.6 F (36.4 C)     Temp Source 12/05/14 1731 Oral     SpO2 12/05/14 1731 99 %     Weight 12/05/14 1731 148 lb (67.132 kg)     Height 12/05/14 1731 5\' 6"  (1.676 m)     Head Cir --      Peak Flow --      Pain Score 12/05/14 1733 5     Pain Loc --      Pain Edu? --      Excl. in GC? --      Constitutional:   Alert and oriented. Well appearing and in no distress. Eyes:   No scleral icterus. No conjunctival pallor. PERRL. EOMI ENT  Head:   Normocephalic and atraumatic.   Nose:   No congestion/rhinnorhea. No septal hematoma   Mouth/Throat:   MMM, no pharyngeal erythema. No peritonsillar mass. No uvula shift.   Neck:   No stridor. No SubQ emphysema. No meningismus. Hematological/Lymphatic/Immunilogical:   No cervical lymphadenopathy. Cardiovascular:   RRR. Normal and symmetric distal pulses are present in all extremities. No murmurs, rubs, or gallops. Respiratory:   Normal respiratory effort without tachypnea nor retractions. Breath sounds are clear and equal bilaterally. No wheezes/rales/rhonchi. Gastrointestinal:   Soft and nontender. No distention. There is no CVA tenderness.  No rebound,  rigidity, or guarding. Genitourinary:   deferred Musculoskeletal:   Nontender with normal range of motion in all extremities. No joint effusions.  No lower extremity tenderness.  No edema. Neurologic:   Normal speech and language.  CN 2-10 normal. Motor grossly intact. No pronator drift.  Normal gait. No gross focal neurologic deficits are appreciated.  Skin:    Skin is warm, dry and intact. No rash noted.  No petechiae, purpura, or bullae. Psychiatric:   Somewhat depressed affect with occasional tearfulness when discussing the stress in her life but rapidly recovers. Speech and behavior are normal. Patient exhibits appropriate insight and judgment.  ____________________________________________    LABS (pertinent positives/negatives) (all labs ordered are listed, but only abnormal results are displayed) Labs Reviewed  BASIC METABOLIC PANEL - Abnormal; Notable for the following:    Glucose, Bld 101 (*)    All other components within normal limits  CBC - Abnormal; Notable for the following:    Hemoglobin 9.0 (*)    HCT 29.8 (*)    MCV 69.0 (*)    MCH 20.9 (*)    MCHC 30.3 (*)    RDW 18.4 (*)    All other components within normal limits  TROPONIN I   ____________________________________________   EKG  Interpreted by me Normal sinus rhythm rate of 86, normal axis intervals QRS and ST segments and T waves.  ____________________________________________    RADIOLOGY  Chest x-ray unremarkable  ____________________________________________   PROCEDURES   ____________________________________________   INITIAL IMPRESSION / ASSESSMENT AND PLAN / ED COURSE  Pertinent labs & imaging results that were available during my care of the patient were reviewed by me and considered in my medical decision making (see chart for details).  Labs unremarkable. Initial blood draw was more than 4 hours after onset of symptoms, so I think the negative troponin is more than sufficient  especially given her medical history and the atypical nature of this chest pain. Over very low suspicion for ACS PE TAD pneumothorax carditis pneumonia or sepsis. This appears to be anxiety related. She followed up with [facility] in the past and I encouraged her to do so again. I also encouraged her to the very cautious with resuming alcohol as she  Acknowledges that she is an alcoholic and is at risk for relapse.     ____________________________________________   FINAL CLINICAL IMPRESSION(S) / ED DIAGNOSES  Final diagnoses:  Chest pain, unspecified chest pain type  Anxiety      Sharman Cheek, MD 12/05/14 2054

## 2014-12-05 NOTE — Discharge Instructions (Signed)
Chest Pain (Nonspecific) °It is often hard to give a specific diagnosis for the cause of chest pain. There is always a chance that your pain could be related to something serious, such as a heart attack or a blood clot in the lungs. You need to follow up with your health care provider for further evaluation. °CAUSES  °· Heartburn. °· Pneumonia or bronchitis. °· Anxiety or stress. °· Inflammation around your heart (pericarditis) or lung (pleuritis or pleurisy). °· A blood clot in the lung. °· A collapsed lung (pneumothorax). It can develop suddenly on its own (spontaneous pneumothorax) or from trauma to the chest. °· Shingles infection (herpes zoster virus). °The chest wall is composed of bones, muscles, and cartilage. Any of these can be the source of the pain. °· The bones can be bruised by injury. °· The muscles or cartilage can be strained by coughing or overwork. °· The cartilage can be affected by inflammation and become sore (costochondritis). °DIAGNOSIS  °Lab tests or other studies may be needed to find the cause of your pain. Your health care provider may have you take a test called an ambulatory electrocardiogram (ECG). An ECG records your heartbeat patterns over a 24-hour period. You may also have other tests, such as: °· Transthoracic echocardiogram (TTE). During echocardiography, sound waves are used to evaluate how blood flows through your heart. °· Transesophageal echocardiogram (TEE). °· Cardiac monitoring. This allows your health care provider to monitor your heart rate and rhythm in real time. °· Holter monitor. This is a portable device that records your heartbeat and can help diagnose heart arrhythmias. It allows your health care provider to track your heart activity for several days, if needed. °· Stress tests by exercise or by giving medicine that makes the heart beat faster. °TREATMENT  °· Treatment depends on what may be causing your chest pain. Treatment may include: °· Acid blockers for  heartburn. °· Anti-inflammatory medicine. °· Pain medicine for inflammatory conditions. °· Antibiotics if an infection is present. °· You may be advised to change lifestyle habits. This includes stopping smoking and avoiding alcohol, caffeine, and chocolate. °· You may be advised to keep your head raised (elevated) when sleeping. This reduces the chance of acid going backward from your stomach into your esophagus. °Most of the time, nonspecific chest pain will improve within 2-3 days with rest and mild pain medicine.  °HOME CARE INSTRUCTIONS  °· If antibiotics were prescribed, take them as directed. Finish them even if you start to feel better. °· For the next few days, avoid physical activities that bring on chest pain. Continue physical activities as directed. °· Do not use any tobacco products, including cigarettes, chewing tobacco, or electronic cigarettes. °· Avoid drinking alcohol. °· Only take medicine as directed by your health care provider. °· Follow your health care provider's suggestions for further testing if your chest pain does not go away. °· Keep any follow-up appointments you made. If you do not go to an appointment, you could develop lasting (chronic) problems with pain. If there is any problem keeping an appointment, call to reschedule. °SEEK MEDICAL CARE IF:  °· Your chest pain does not go away, even after treatment. °· You have a rash with blisters on your chest. °· You have a fever. °SEEK IMMEDIATE MEDICAL CARE IF:  °· You have increased chest pain or pain that spreads to your arm, neck, jaw, back, or abdomen. °· You have shortness of breath. °· You have an increasing cough, or you cough   up blood. °· You have severe back or abdominal pain. °· You feel nauseous or vomit. °· You have severe weakness. °· You faint. °· You have chills. °This is an emergency. Do not wait to see if the pain will go away. Get medical help at once. Call your local emergency services (911 in [place] Do not drive  yourself to the hospital. °MAKE SURE YOU:  °· Understand these instructions. °· Will watch your condition. °· Will get help right away if you are not doing well or get worse. °Document Released: 12/16/2004 Document Revised: 03/13/2013 Document Reviewed: 10/12/2007 °ExitCare® Patient Information ©2015 [employer]. This information is not intended to replace advice given to you by your health care provider. Make sure you discuss any questions you have with your health care provider. ° °Panic Attacks °Panic attacks are sudden, short-lived surges of severe anxiety, fear, or discomfort. They may occur for no reason when you are relaxed, when you are anxious, or when you are sleeping. Panic attacks may occur for a number of reasons:  °· Healthy people occasionally have panic attacks in extreme, life-threatening situations, such as war or natural disasters. Normal anxiety is a protective mechanism of the body that helps us react to danger (fight or flight response). °· Panic attacks are often seen with anxiety disorders, such as panic disorder, social anxiety disorder, generalized anxiety disorder, and phobias. Anxiety disorders cause excessive or uncontrollable anxiety. They may interfere with your relationships or other life activities. °· Panic attacks are sometimes seen with other mental illnesses, such as depression and posttraumatic stress disorder. °· Certain medical conditions, prescription medicines, and drugs of abuse can cause panic attacks. °SYMPTOMS  °Panic attacks start suddenly, peak within 20 minutes, and are accompanied by four or more of the following symptoms: °· Pounding heart or fast heart rate (palpitations). °· Sweating. °· Trembling or shaking. °· Shortness of breath or feeling smothered. °· Feeling choked. °· Chest pain or discomfort. °· Nausea or strange feeling in your stomach. °· Dizziness, light-headedness, or feeling like you will faint. °· Chills or hot flushes. °· Numbness or tingling in  your lips or hands and feet. °· Feeling that things are not real or feeling that you are not yourself. °· Fear of losing control or going crazy. °· Fear of dying. °Some of these symptoms can mimic serious medical conditions. For example, you may think you are having a heart attack. Although panic attacks can be very scary, they are not life threatening. °DIAGNOSIS  °Panic attacks are diagnosed through an assessment by your health care provider. Your health care provider will ask questions about your symptoms, such as where and when they occurred. Your health care provider will also ask about your medical history and use of alcohol and drugs, including prescription medicines. Your health care provider may order blood tests or other studies to rule out a serious medical condition. Your health care provider may refer you to a mental health professional for further evaluation. °TREATMENT  °· Most healthy people who have one or two panic attacks in an extreme, life-threatening situation will not require treatment. °· The treatment for panic attacks associated with anxiety disorders or other mental illness typically involves counseling with a mental health professional, medicine, or a combination of both. Your health care provider will help determine what treatment is best for you. °· Panic attacks due to physical illness usually go away with treatment of the illness. If prescription medicine is causing panic attacks, talk with your health care   provider about stopping the medicine, decreasing the dose, or substituting another medicine. °· Panic attacks due to alcohol or drug abuse go away with abstinence. Some adults need professional help in order to stop drinking or using drugs. °HOME CARE INSTRUCTIONS  °· Take all medicines as directed by your health care provider.   °· Schedule and attend follow-up visits as directed by your health care provider. It is important to keep all your appointments. °SEEK MEDICAL CARE  IF: °· You are not able to take your medicines as prescribed. °· Your symptoms do not improve or get worse. °SEEK IMMEDIATE MEDICAL CARE IF:  °· You experience panic attack symptoms that are different than your usual symptoms. °· You have serious thoughts about hurting yourself or others. °· You are taking medicine for panic attacks and have a serious side effect. °MAKE SURE YOU: °· Understand these instructions. °· Will watch your condition. °· Will get help right away if you are not doing well or get worse. °Document Released: 03/08/2005 Document Revised: 03/13/2013 Document Reviewed: 10/20/2012 °ExitCare® Patient Information ©2015 [employer]. This information is not intended to replace advice given to you by your health care provider. Make sure you discuss any questions you have with your health care provider. ° °

## 2014-12-05 NOTE — ED Notes (Signed)
Pt says about 2 hr ago started chest palpitations and chst pain.  Has had in the past,b ut this did not go away and went into jaw.  Also has recent tick bites.

## 2015-01-06 ENCOUNTER — Emergency Department
Admission: EM | Admit: 2015-01-06 | Discharge: 2015-01-07 | Disposition: A | Discharge status: Other Acute Inpatient Hospital | Payer: Self-pay | Attending: Emergency Medicine | Admitting: Emergency Medicine

## 2015-01-06 ENCOUNTER — Encounter: Payer: Self-pay | Admitting: Emergency Medicine

## 2015-01-06 DIAGNOSIS — Z3202 Encounter for pregnancy test, result negative: Secondary | ICD-10-CM | POA: Insufficient documentation

## 2015-01-06 DIAGNOSIS — F10120 Alcohol abuse with intoxication, uncomplicated: Secondary | ICD-10-CM | POA: Insufficient documentation

## 2015-01-06 DIAGNOSIS — F1020 Alcohol dependence, uncomplicated: Secondary | ICD-10-CM | POA: Diagnosis present

## 2015-01-06 DIAGNOSIS — F1994 Other psychoactive substance use, unspecified with psychoactive substance-induced mood disorder: Secondary | ICD-10-CM

## 2015-01-06 DIAGNOSIS — L089 Local infection of the skin and subcutaneous tissue, unspecified: Secondary | ICD-10-CM | POA: Insufficient documentation

## 2015-01-06 DIAGNOSIS — R45851 Suicidal ideations: Secondary | ICD-10-CM | POA: Insufficient documentation

## 2015-01-06 DIAGNOSIS — F10239 Alcohol dependence with withdrawal, unspecified: Secondary | ICD-10-CM

## 2015-01-06 DIAGNOSIS — F10939 Alcohol use, unspecified with withdrawal, unspecified: Secondary | ICD-10-CM

## 2015-01-06 DIAGNOSIS — F10920 Alcohol use, unspecified with intoxication, uncomplicated: Secondary | ICD-10-CM

## 2015-01-06 DIAGNOSIS — F4310 Post-traumatic stress disorder, unspecified: Secondary | ICD-10-CM

## 2015-01-06 HISTORY — DX: Alcohol abuse, uncomplicated: F10.10

## 2015-01-06 LAB — COMPREHENSIVE METABOLIC PANEL
ALT: 17 U/L (ref 14–54)
ANION GAP: 9 (ref 5–15)
AST: 31 U/L (ref 15–41)
Albumin: 4.4 g/dL (ref 3.5–5.0)
Alkaline Phosphatase: 56 U/L (ref 38–126)
BUN: 13 mg/dL (ref 6–20)
CHLORIDE: 105 mmol/L (ref 101–111)
CO2: 23 mmol/L (ref 22–32)
Calcium: 8.6 mg/dL — ABNORMAL LOW (ref 8.9–10.3)
Creatinine, Ser: 0.54 mg/dL (ref 0.44–1.00)
Glucose, Bld: 84 mg/dL (ref 65–99)
POTASSIUM: 3.7 mmol/L (ref 3.5–5.1)
SODIUM: 137 mmol/L (ref 135–145)
Total Bilirubin: 0.4 mg/dL (ref 0.3–1.2)
Total Protein: 8.2 g/dL — ABNORMAL HIGH (ref 6.5–8.1)

## 2015-01-06 LAB — URINE DRUG SCREEN, QUALITATIVE (ARMC ONLY)
AMPHETAMINES, UR SCREEN: NOT DETECTED
BENZODIAZEPINE, UR SCRN: NOT DETECTED
Barbiturates, Ur Screen: NOT DETECTED
Cannabinoid 50 Ng, Ur ~~LOC~~: NOT DETECTED
Cocaine Metabolite,Ur ~~LOC~~: NOT DETECTED
MDMA (ECSTASY) UR SCREEN: NOT DETECTED
METHADONE SCREEN, URINE: NOT DETECTED
OPIATE, UR SCREEN: NOT DETECTED
PHENCYCLIDINE (PCP) UR S: NOT DETECTED
Tricyclic, Ur Screen: NOT DETECTED

## 2015-01-06 LAB — CBC
HEMATOCRIT: 30 % — AB (ref 35.0–47.0)
Hemoglobin: 9.2 g/dL — ABNORMAL LOW (ref 12.0–16.0)
MCH: 20.9 pg — ABNORMAL LOW (ref 26.0–34.0)
MCHC: 30.5 g/dL — ABNORMAL LOW (ref 32.0–36.0)
MCV: 68.6 fL — AB (ref 80.0–100.0)
Platelets: 410 10*3/uL (ref 150–440)
RBC: 4.38 MIL/uL (ref 3.80–5.20)
RDW: 19.1 % — ABNORMAL HIGH (ref 11.5–14.5)
WBC: 6.4 10*3/uL (ref 3.6–11.0)

## 2015-01-06 LAB — ETHANOL: ALCOHOL ETHYL (B): 364 mg/dL — AB (ref <5)

## 2015-01-06 LAB — POCT PREGNANCY, URINE: PREG TEST UR: NEGATIVE

## 2015-01-06 MED ORDER — VITAMIN B-1 100 MG PO TABS
100.0000 mg | ORAL_TABLET | Freq: Every day | ORAL | Status: DC
  Administered 2015-01-06: 100 mg via ORAL
  Filled 2015-01-06: qty 1

## 2015-01-06 MED ORDER — LORAZEPAM 2 MG PO TABS: 0.0000 mg | ORAL_TABLET | Freq: Two times a day (BID) | ORAL | Status: DC

## 2015-01-06 MED ORDER — THIAMINE HCL 100 MG/ML IJ SOLN: 100.0000 mg | Freq: Every day | INTRAMUSCULAR | Status: DC

## 2015-01-06 MED ORDER — LORAZEPAM 1 MG PO TABS
1.0000 mg | ORAL_TABLET | Freq: Once | ORAL | Status: AC
  Administered 2015-01-06: 1 mg via ORAL
  Filled 2015-01-06: qty 1

## 2015-01-06 MED ORDER — LORAZEPAM 2 MG/ML IJ SOLN: 0.0000 mg | Freq: Two times a day (BID) | INTRAMUSCULAR | Status: DC

## 2015-01-06 MED ORDER — LORAZEPAM 2 MG PO TABS
0.0000 mg | ORAL_TABLET | Freq: Four times a day (QID) | ORAL | Status: DC
  Administered 2015-01-07 (×2): 1 mg via ORAL
  Filled 2015-01-06: qty 1

## 2015-01-06 MED ORDER — LORAZEPAM 2 MG/ML IJ SOLN: 0.0000 mg | Freq: Four times a day (QID) | INTRAMUSCULAR | Status: DC

## 2015-01-06 NOTE — ED Provider Notes (Signed)
Time Seen: Approximately 1340 I have reviewed the triage notes  Chief Complaint: Alcohol Problem   History of Present Illness: Shannon Duffy is a 39 y.o. female who presents with request of alcohol treatment. Patient apparently has a long history of alcohol abuse and did have some alcohol earlier today. She states she thought about killing herself a couple of hours ago and was using a knife to cut the wrist but did not create any significant injury. He denies any current suicidal thoughts, homicidal thoughts or hallucinations. Patient describes being and feeling very anxious. She otherwise denies any physical complaints.   Past Medical History  Diagnosis Date  . Panic   . Alcohol abuse     Patient Active Problem List   Diagnosis Date Noted  . Alcohol use disorder, moderate, dependence (HCC) 09/21/2014    Past Surgical History  Procedure Laterality Date  . Cesarean section      Past Surgical History  Procedure Laterality Date  . Cesarean section      No current outpatient prescriptions on file.  Allergies:  Review of patient's allergies indicates no known allergies.  Family History: No family history on file.  Social History: Social History  Substance Use Topics  . Smoking status: Never Smoker   . Smokeless tobacco: None  . Alcohol Use: Yes     Comment: 6 40 oz beers / day     Review of Systems:   10 point review of systems was performed and was otherwise negative:  Constitutional: No fever Eyes: No visual disturbances ENT: No sore throat, ear pain Cardiac: No chest pain Respiratory: No shortness of breath, wheezing, or stridor Abdomen: No abdominal pain, no vomiting, No diarrhea Endocrine: No weight loss, No night sweats Extremities: No peripheral edema, cyanosis Skin: No rashes, easy bruising Neurologic: No focal weakness, trouble with speech or swollowing Urologic: No dysuria, Hematuria, or urinary frequency   Physical Exam:  ED Triage  Vitals  Enc Vitals Group     BP 01/06/15 1330 153/97 mmHg     Pulse Rate 01/06/15 1330 135     Resp 01/06/15 1330 26     Temp 01/06/15 1330 97.7 F (36.5 C)     Temp Source 01/06/15 1330 Oral     SpO2 01/06/15 1330 98 %     Weight 01/06/15 1330 145 lb (65.772 kg)     Height 01/06/15 1330  (1.676 m)     Head Cir --      Peak Flow --      Pain Score 01/06/15 1331 0     Pain Loc --      Pain Edu? --      Excl. in GC? --     General: Awake , Alert , and Oriented times 3; GCS 15 Head: Normal cephalic , atraumatic Eyes: Pupils equal , round, reactive to light Nose/Throat: No nasal drainage, patent upper airway without erythema or exudate.  Neck: Supple, Full range of motion, No anterior adenopathy or palpable thyroid masses Lungs: Clear to ascultation without wheezes , rhonchi, or rales Heart: Regular rate, regular rhythm without murmurs , gallops , or rubs Abdomen: Soft, non tender without rebound, guarding , or rigidity; bowel sounds positive and symmetric in all 4 quadrants. No organomegaly .        Extremities: 2 plus symmetric pulses. No edema, clubbing or cyanosis Neurologic: normal ambulation, Motor symmetric without deficits, sensory intact Skin: Patient has numerous very superficial lesions on the anterior surface of  her right wrist. None are suturable there is no active bleeding at this time.   Labs:   All laboratory work was reviewed including any pertinent negatives or positives listed below:  Labs Reviewed  COMPREHENSIVE METABOLIC PANEL - Abnormal; Notable for the following:    Calcium 8.6 (*)    Total Protein 8.2 (*)    All other components within normal limits  ETHANOL - Abnormal; Notable for the following:    Alcohol, Ethyl (B) 364 (*)    All other components within normal limits  CBC - Abnormal; Notable for the following:    Hemoglobin 9.2 (*)    HCT 30.0 (*)    MCV 68.6 (*)    MCH 20.9 (*)    MCHC 30.5 (*)    RDW 19.1 (*)    All other components  within normal limits  URINE DRUG SCREEN, QUALITATIVE ([facility] ONLY)  POC URINE PREG, ED      ED Course:  Recheck on her heart rate after food and Ativan decreased at 93  The patient will have IVC paperwork filled out and will be seen by the substance abuse services. She will also be evaluated by psychiatry due to the attempts at lacerating her wrists. She still is very inebriated at this time though on laboratory work, clinically she answers all questions appropriately.  Final Clinical Impression Suicidal ideation Alcohol intoxication  Final diagnoses:  None     Plan: Involuntary commitment Psychiatric consultation Benzodiazepine therapy            Jennye MoccasinBrian S Abbeygail Igoe, MD 01/06/15 1620
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
## 2015-01-06 NOTE — ED Notes (Signed)
BEHAVIORAL HEALTH ROUNDING Patient sleeping: No. Patient alert and oriented: yes Behavior appropriate: Yes.  ; If no, describe:  Nutrition and fluids offered: Yes  Toileting and hygiene offered: Yes  Sitter present: no Law enforcement present: Yes  

## 2015-01-06 NOTE — ED Notes (Signed)
ED BHU PLACEMENT JUSTIFICATION Is the patient under IVC or is there intent for IVC: Yes.   Is the patient medically cleared: No. Is there vacancy in the ED BHU: Yes.   Is the population mix appropriate for patient: Yes.   Is the patient awaiting placement in inpatient or outpatient setting: Yes.   Has the patient had a psychiatric consult: No. Survey of unit performed for contraband, proper placement and condition of furniture, tampering with fixtures in bathroom, shower, and each patient room: Yes.  ; Findings:  APPEARANCE/BEHAVIOR anxious NEURO ASSESSMENT Orientation: time, place and person Hallucinations: No.None noted (Hallucinations) Speech: Normal Gait: normal RESPIRATORY ASSESSMENT Normal expansion.  Clear to auscultation.  No rales, rhonchi, or wheezing. CARDIOVASCULAR ASSESSMENT regular rate and rhythm, S1, S2 normal, no murmur, click, rub or gallop GASTROINTESTINAL ASSESSMENT soft, nontender, BS WNL, no r/g EXTREMITIES normal strength, tone, and muscle mass PLAN OF CARE Provide calm/safe environment. Vital signs assessed twice daily. ED BHU Assessment once each 12-hour shift. Collaborate with intake RN daily or as condition indicates. Assure the ED provider has rounded once each shift. Provide and encourage hygiene. Provide redirection as needed. Assess for escalating behavior; address immediately and inform ED provider.  Assess family dynamic and appropriateness for visitation as needed: Yes.  ; If necessary, describe findings:  Educate the patient/family about BHU procedures/visitation: ; If necessary, describe findings:

## 2015-01-06 NOTE — ED Notes (Signed)
BEHAVIORAL HEALTH ROUNDING Patient sleeping: No. Patient alert and oriented: yes Behavior appropriate: No.; If no, describe: anxious Nutrition and fluids offered: Yes  Toileting and hygiene offered: Yes  Sitter present: no Law enforcement present: Yes

## 2015-01-06 NOTE — ED Notes (Signed)
Denies SI/ HI.  Has history of cutting.  Patient states she had thought about cutting herself a few hours ago, but did not want to do that.

## 2015-01-06 NOTE — ED Notes (Signed)
Patient is an alcoholic had been clean for about a year and a half and started drinking again in January 2016.  Reports drinking worsening over the past couple of months.  Patient states she is drinking "all day and all night"  Not sleeping well.  Wakes at night and drinks.  Patient is tearful and anxious in triage.  Patient wants detox.

## 2015-01-06 NOTE — ED Notes (Signed)
BEHAVIORAL HEALTH ROUNDING Patient sleeping: no Patient alert and oriented: yes Behavior appropriate: yes; If no, describe:  Nutrition and fluids offered: Yes  Toileting and hygiene offered: Yes  Sitter present: no Law enforcement present: Yes

## 2015-01-07 ENCOUNTER — Observation Stay (HOSPITAL_COMMUNITY)
Admission: AD | Admit: 2015-01-07 | Payer: No Typology Code available for payment source | Source: Intra-hospital | Admitting: Psychiatry

## 2015-01-07 DIAGNOSIS — F1020 Alcohol dependence, uncomplicated: Secondary | ICD-10-CM

## 2015-01-07 DIAGNOSIS — F10239 Alcohol dependence with withdrawal, unspecified: Secondary | ICD-10-CM

## 2015-01-07 DIAGNOSIS — F1994 Other psychoactive substance use, unspecified with psychoactive substance-induced mood disorder: Secondary | ICD-10-CM

## 2015-01-07 DIAGNOSIS — F10939 Alcohol use, unspecified with withdrawal, unspecified: Secondary | ICD-10-CM

## 2015-01-07 DIAGNOSIS — F4310 Post-traumatic stress disorder, unspecified: Secondary | ICD-10-CM

## 2015-01-07 LAB — ETHANOL

## 2015-01-07 MED ORDER — LORAZEPAM 1 MG PO TABS
ORAL_TABLET | ORAL | Status: AC
  Administered 2015-01-07: 1 mg via ORAL
  Filled 2015-01-07: qty 1

## 2015-01-07 MED ORDER — LORAZEPAM 1 MG PO TABS
ORAL_TABLET | ORAL | Status: AC
  Filled 2015-01-07: qty 1

## 2015-01-07 NOTE — ED Notes (Signed)
Pt. Noted in room resting quietly;. No complaints or concerns voiced. No distress or abnormal behavior noted. Will continue to monitor with security cameras. Q 15 minute rounds continue. 

## 2015-01-07 NOTE — ED Notes (Signed)

## 2015-01-07 NOTE — ED Notes (Signed)
Pt. Is taking a shower this A.M.

## 2015-01-07 NOTE — ED Notes (Signed)
2/2 bags of belongings returned to her and she verbalized that she received back all belongings that she came here with   Discharge and follow up instructions reviewed with her and she verbalized agreement and understanding

## 2015-01-07 NOTE — ED Notes (Signed)
Pt observed lying in bed   Pt visualized with NAD  No verbalized needs or concerns at this time  Continue to monitor 

## 2015-01-07 NOTE — ED Notes (Signed)
Patient given lunch tray. Resting in room

## 2015-01-07 NOTE — ED Notes (Signed)
Pt. Noted in room. No complaints or concerns voiced. No distress or abnormal behavior noted. Will continue to monitor with security cameras. Q 15 minute rounds continue. 

## 2015-01-07 NOTE — ED Notes (Signed)
BEHAVIORAL HEALTH ROUNDING Patient sleeping: No. Patient alert and oriented: yes Behavior appropriate: Yes.  ; If no, describe:  Nutrition and fluids offered: yes Toileting and hygiene offered: Yes  Sitter present: q15 minute observations and security camera monitoring Law enforcement present: Yes  ODS  

## 2015-01-07 NOTE — ED Notes (Signed)
ED BHU PLACEMENT JUSTIFICATION Is the patient under IVC or is there intent for IVC: Yes.   Is the patient medically cleared: Yes.   Is there vacancy in the ED BHU: Yes.   Is the population mix appropriate for patient: Yes.   Is the patient awaiting placement in inpatient or outpatient setting:   Has the patient had a psychiatric consult: pending Survey of unit performed for contraband, proper placement and condition of furniture, tampering with fixtures in bathroom, shower, and each patient room: Yes.  ; Findings:  APPEARANCE/BEHAVIOR Calm and cooperative NEURO ASSESSMENT Orientation: oriented x3  Denies pain Hallucinations: No.None noted (Hallucinations) Speech: Normal Gait: normal RESPIRATORY ASSESSMENT Even  Unlabored respirations  CARDIOVASCULAR ASSESSMENT Pulses equal   regular rate  Skin warm and dry   GASTROINTESTINAL ASSESSMENT no GI complaint EXTREMITIES Full ROM  PLAN OF CARE Provide calm/safe environment. Vital signs assessed twice daily. ED BHU Assessment once each 12-hour shift. Collaborate with intake RN daily or as condition indicates. Assure the ED provider has rounded once each shift. Provide and encourage hygiene. Provide redirection as needed. Assess for escalating behavior; address immediately and inform ED provider.  Assess family dynamic and appropriateness for visitation as needed: Yes.  ; If necessary, describe findings:  Educate the patient/family about BHU procedures/visitation: Yes.  ; If necessary, describe findings:

## 2015-01-07 NOTE — ED Notes (Signed)
[place] Pt. Alert and oriented in no distress denies SI, HI, AVH and pain.  Pt. Instructed to come to me with problems or concerns.Will continue to monitor for safety via security cameras and Q 15 minute checks.

## 2015-01-07 NOTE — ED Notes (Signed)
Breakfast provided  Pt requesting a shower   Pt observed with no unusual behavior  Appropriate to stimulation  No verbalized needs or concerns at this time  NAD assessed  Continue to monitor

## 2015-01-07 NOTE — ED Notes (Signed)
md Clapacs is consulting at this time   

## 2015-01-07 NOTE — ED Provider Notes (Signed)
-----------------------------------------   7:26 AM on 01/07/2015 -----------------------------------------   Blood pressure 132/88, pulse 96, temperature 97.7 F (36.5 C), temperature source Oral, resp. rate 18, height 5\' 6"  (1.676 m), weight 145 lb (65.772 kg), last menstrual period 01/03/2015, SpO2 95 %.  The patient had no acute events since last update.  Calm and cooperative at this time.  Disposition is pending per Psychiatry/Behavioral Medicine team recommendations.     Rockne MenghiniAnne-Caroline Aveline Daus, MD 01/07/15 [phone]

## 2015-01-07 NOTE — Progress Notes (Signed)
Writer has received a call from Inetta Fermo(Tina)  [facility], following bridge call. Inetta Fermoina has informed TTS that the pt has been accepted to the Observation Unit (bed 4), Per Dr. Lucianne MussKumar. Writer has made Dr. Toni Amendlapacs aware of this transfer. Dr. Toni Amendlapacs has rescinded the decision to transfer this pt due to the pt now being appropriate for discharge. Pt will be discharged home with referral information to [facility] IOP program.       01/07/2015 Cheryl FlashNicole Quantisha Marsicano, MS, NCC, LPCA Therapeutic Triage Specialist

## 2015-01-07 NOTE — ED Notes (Addendum)
CIWA Score=8; received Ativan 1 mg p.o. Client stated that there is a family h/o alcohol abuse; especially her father. Client and also states that; she has been drinking since a very early age; was clean for 1 1/2 years and relapsed Jan. '2016; and has been drinking 4-6(40 oz beers) a day; also has a h/o cutting; and has not had any self injury for several months; currently denies having any s.i/h.i.

## 2015-01-07 NOTE — Discharge Instructions (Signed)
Please go to [facility] for substance abuse help as planned. Please return to the emergency department if you develop thoughts of hurting you're self, thoughts of hurting anyone else, hallucinations, shakiness or tremulousness, vomiting, seizures, or any other symptoms concerning to you.  Alcohol Intoxication Alcohol intoxication occurs when you drink enough alcohol that it affects your ability to function. It can be mild or very severe. Drinking a lot of alcohol in a short time is called binge drinking. This can be very harmful. Drinking alcohol can also be more dangerous if you are taking medicines or other drugs. Some of the effects caused by alcohol may include:  Loss of coordination.  Changes in mood and behavior.  Unclear thinking.  Trouble talking (slurred speech).  Throwing up (vomiting).  Confusion.  Slowed breathing.  Twitching and shaking (seizures).  Loss of consciousness. HOME CARE  Do not drive after drinking alcohol.  Drink enough water and fluids to keep your pee (urine) clear or pale yellow. Avoid caffeine.  Only take medicine as told by your doctor. GET HELP IF:  You throw up (vomit) many times.  You do not feel better after a few days.  You frequently have alcohol intoxication. Your doctor can help decide if you should see a substance use treatment counselor. GET HELP RIGHT AWAY IF:  You become shaky when you stop drinking.  You have twitching and shaking.  You throw up blood. It may look bright red or like coffee grounds.  You notice blood in your poop (bowel movements).  You become lightheaded or pass out (faint). MAKE SURE YOU:   Understand these instructions.  Will watch your condition.  Will get help right away if you are not doing well or get worse.   This information is not intended to replace advice given to you by your health care provider. Make sure you discuss any questions you have with your health care provider.   Document Released:  08/25/2007 Document Revised: 11/08/2012 Document Reviewed: 08/11/2012 Elsevier Interactive Patient Education [employer]2016 Elsevier Inc.

## 2015-01-07 NOTE — Consult Note (Signed)
[facility] Consult   Reason for Consult:  Consult for this 39 year old woman who presented intoxicated yesterday requesting detox Referring Physician:  Mariea Clonts Patient Identification: Shannon Duffy MRN:  [id number] Principal Diagnosis: Alcohol use disorder, moderate, dependence (Shannon Duffy) Diagnosis:   Patient Active Problem List   Diagnosis Date Noted  . Alcohol withdrawal ([place]) [F10.239] 01/07/2015  . Substance induced mood disorder ([place]) [F19.94] 01/07/2015  . PTSD (post-traumatic stress disorder) [F43.10] 01/07/2015  . Alcohol use disorder, moderate, dependence ([place]) [F10.20] 09/21/2014    Total Time spent with patient: 1 hour  Subjective:   Shannon Duffy is a 39 y.o. female patient admitted with "I know I need to stop drinking".  HPI:  Information from the patient and the chart. Patient interviewed. Chart reviewed. Old chart including previous admissions reviewed. Laboratory studies reviewed vital signs reviewed. This 39 year old woman came to the hospital yesterday clearly intoxicated and stating that she needed help stopping drinking. Patient tells me today that she has been drinking 4-6 40 ounce bottles of beer a day for the last several weeks. This is an escalation in her drinking but she has been drinking regularly at least since this past January when she relapsed. She denies that she's been using any other recreational drugs. Patient's mood has been labile. She describes an episode recently when she had 2 or 3 days of not sleeping and feeling very energetic alternating with times when she feels down and discouraged and anxious. She denies that she's had any suicidal thoughts. Denies that she's had any psychotic symptoms. Patient feels motivated to stop drinking knowing that it worsens her mood and that it disrupts her relationships with her family. On interview today the patient has sobered up and is stating a request to be discharged home. She is not actively  complaining of any acute mood symptoms.  Past psychiatric history notable for long-standing alcohol abuse. She's had at least 1 inpatient hospitalization here for alcohol withdrawal and has had outpatient treatment through [facility] and their intensive outpatient program. Familiar with outpatient treatment. She has a past history of self-mutilation but not of suicide attempts and is no longer cutting herself. She has been on antidepressant medication on a couple of occasions in the past and found it to be helpful particularly remembering Zoloft. She is been diagnosed variously with bipolar disorder and PTSD in addition to her alcohol problem. [place] history: Patient has a history of chronic back pain. Otherwise no significant ongoing medical problems. Currently not on any prescription medicine.  Social history: Patient lives with her fianc. 2 of them have been together for several years and she thinks it's a good relationship. Apparently the fianc does not drink. She has 3 adolescent children one of whom is currently living with her. Patient does not work outside the home. She does not drive because she has never bothered to regain her driver's license which she knows is probably pretty good judgment when she's been drinking so heavily.  Family history: Positive for substance abuse in several family members including alcohol abuse in her father and narcotic abuse in a sibling and her mother.  Substance abuse history: Long-standing alcohol dependence. She's been able to stay sober for up to a year and a half in the past by going to outpatient treatment. She describes for me an episode that she calls a seizure in the past although clinically it sounds like it may have been more of an anxiety attack. She doesn't have consistent seizures. She  also says that she has had something that might be delirium tremens in the past but has not consistently had it. Denies that she is abusing any other drugs. Does not  smoke.  Past Psychiatric History: Past history of self-mutilation but not suicide attempts. Diagnosed variously with PTSD or bipolar disorder or depression. Has responded well to antidepressants at times in the past. Has had one previous hospitalization here that we know of.  Risk to Self: Suicidal Ideation: No Suicidal Intent: No Is patient at risk for suicide?: No Suicidal Plan?: No Access to Means: No What has been your use of drugs/alcohol within the last 12 months?: Drinks alcohol to excess daily How many times?: 0 Other Self Harm Risks: Cuts (States-I used to cut myself a lot, but I never wanted to die) Triggers for Past Attempts: Unknown Intentional Self Injurious Behavior: Cutting Comment - Self Injurious Behavior: I cut when I'm mad (I feel like no one listens to me) Risk to Others: Homicidal Ideation: No Thoughts of Harm to Others: No Current Homicidal Intent: No Current Homicidal Plan: No Access to Homicidal Means: No Identified Victim: None reported History of harm to others?: No Assessment of Violence: None Noted Does patient have access to weapons?: No Criminal Charges Pending?: No Does patient have a court date: No Prior Inpatient Therapy: Prior Inpatient Therapy: Yes Prior Therapy Dates: 2014 Prior Therapy Facilty/Provider(s): [facility] Reason for Treatment: Depression/Alcohol Prior Outpatient Therapy: Prior Outpatient Therapy: No Does patient have an ACCT team?: No Does patient have Intensive In-House Services?  : No Does patient have [facility] services? : No Does patient have P4CC services?: No  Past Medical History:  Past Medical History  Diagnosis Date  . Panic   . Alcohol abuse     Past Surgical History  Procedure Laterality Date  . Cesarean section     Family History: No family history on file. Family Psychiatric  History: Multiple first-degree relatives with substance abuse including her father mother and one sibling Social History:  History  Alcohol  Use  . Yes    Comment: 6 40 oz beers / day     History  Drug Use No    Social History   Social History  . Marital Status: Single    Spouse Name: N/A  . Number of Children: N/A  . Years of Education: N/A   Social History Main Topics  . Smoking status: Never Smoker   . Smokeless tobacco: None  . Alcohol Use: Yes     Comment: 6 40 oz beers / day  . Drug Use: No  . Sexual Activity: Not Asked   Other Topics Concern  . None   Social History Narrative   Additional Social History:    History of alcohol / drug use?: Yes Withdrawal Symptoms: Sweats, Fever / Chills, Irritability, Blackouts, Agitation Name of Substance 1: Alcohol 1 - Age of First Use: 14 1 - Amount (size/oz): 5-7 40oz natural ice beers a day 1 - Frequency: Daily 1 - Last Use / Amount: 01/06/2015 - 3 40oz beers                   Allergies:  No Known Allergies  Labs:  Results for orders placed or performed during the hospital encounter of 01/06/15 (from the past 48 hour(s))  Urine Drug Screen, Qualitative ([facility] only)     Status: None   Collection Time: 01/06/15 12:30 PM  Result Value Ref Range   Tricyclic, Ur Screen NONE DETECTED NONE DETECTED  Amphetamines, Ur Screen NONE DETECTED NONE DETECTED   MDMA (Ecstasy)Ur Screen NONE DETECTED NONE DETECTED   Cocaine Metabolite,Ur [place] NONE DETECTED NONE DETECTED   Opiate, Ur Screen NONE DETECTED NONE DETECTED   Phencyclidine (PCP) Ur S NONE DETECTED NONE DETECTED   Cannabinoid 50 Ng, Ur [place] NONE DETECTED NONE DETECTED   Barbiturates, Ur Screen NONE DETECTED NONE DETECTED   Benzodiazepine, Ur Scrn NONE DETECTED NONE DETECTED   Methadone Scn, Ur NONE DETECTED NONE DETECTED    Comment: (NOTE) 948  Tricyclics, urine               Cutoff 1000 ng/mL 200  Amphetamines, urine             Cutoff 1000 ng/mL 300  MDMA (Ecstasy), urine           Cutoff 500 ng/mL 400  Cocaine Metabolite, urine       Cutoff 300 ng/mL 500  Opiate, urine                   Cutoff 300  ng/mL 600  Phencyclidine (PCP), urine      Cutoff 25 ng/mL 700  Cannabinoid, urine              Cutoff 50 ng/mL 800  Barbiturates, urine             Cutoff 200 ng/mL 900  Benzodiazepine, urine           Cutoff 200 ng/mL 1000 Methadone, urine                Cutoff 300 ng/mL 1100 1200 The urine drug screen provides only a preliminary, unconfirmed 1300 analytical test result and should not be used for non-medical 1400 purposes. Clinical consideration and professional judgment should 1500 be applied to any positive drug screen result due to possible 1600 interfering substances. A more specific alternate chemical method 1700 must be used in order to obtain a confirmed analytical result.  1800 Gas chromato graphy / mass spectrometry (GC/MS) is the preferred 1900 confirmatory method.   Comprehensive metabolic panel     Status: Abnormal   Collection Time: 01/06/15  1:38 PM  Result Value Ref Range   Sodium 137 135 - 145 mmol/L   Potassium 3.7 3.5 - 5.1 mmol/L   Chloride 105 101 - 111 mmol/L   CO2 23 22 - 32 mmol/L   Glucose, Bld 84 65 - 99 mg/dL   BUN 13 6 - 20 mg/dL   Creatinine, Ser 0.54 0.44 - 1.00 mg/dL   Calcium 8.6 (L) 8.9 - 10.3 mg/dL   Total Protein 8.2 (H) 6.5 - 8.1 g/dL   Albumin 4.4 3.5 - 5.0 g/dL   AST 31 15 - 41 U/L   ALT 17 14 - 54 U/L   Alkaline Phosphatase 56 38 - 126 U/L   Total Bilirubin 0.4 0.3 - 1.2 mg/dL   GFR calc non Af Amer >60 >60 mL/min   GFR calc Af Amer >60 >60 mL/min    Comment: (NOTE) The eGFR has been calculated using the CKD EPI equation. This calculation has not been validated in all clinical situations. eGFR's persistently <60 mL/min signify possible Chronic Kidney Disease.    Anion gap 9 5 - 15  Ethanol (ETOH)     Status: Abnormal   Collection Time: 01/06/15  1:38 PM  Result Value Ref Range   Alcohol, Ethyl (B) 364 (HH) <5 mg/dL    Comment: CRITICAL RESULT CALLED TO, READ BACK BY  AND VERIFIED WITH MOLLY SMITH AT [phone] 01/06/15 [facility]         LOWEST DETECTABLE LIMIT FOR SERUM ALCOHOL IS 5 mg/dL FOR MEDICAL PURPOSES ONLY   CBC     Status: Abnormal   Collection Time: 01/06/15  1:38 PM  Result Value Ref Range   WBC 6.4 3.6 - 11.0 K/uL   RBC 4.38 3.80 - 5.20 MIL/uL   Hemoglobin 9.2 (L) 12.0 - 16.0 g/dL   HCT 30.0 (L) 35.0 - 47.0 %   MCV 68.6 (L) 80.0 - 100.0 fL   MCH 20.9 (L) 26.0 - 34.0 pg   MCHC 30.5 (L) 32.0 - 36.0 g/dL   RDW 19.1 (H) 11.5 - 14.5 %   Platelets 410 150 - 440 K/uL  Pregnancy, urine POC     Status: None   Collection Time: 01/06/15  8:20 PM  Result Value Ref Range   Preg Test, Ur NEGATIVE NEGATIVE    Comment:        THE SENSITIVITY OF THIS METHODOLOGY IS >24 mIU/mL     Current Facility-Administered Medications  Medication Dose Route Frequency Provider Last Rate Last Dose  . LORazepam (ATIVAN) 1 MG tablet           . LORazepam (ATIVAN) tablet 0-4 mg  0-4 mg Oral 4 times per day Orbie Pyo, MD   1 mg at 01/07/15 0604  . thiamine (B-1) injection 100 mg  100 mg Intravenous Daily Orbie Pyo, MD   100 mg at 01/06/15 2020  . thiamine (VITAMIN B-1) tablet 100 mg  100 mg Oral Daily Orbie Pyo, MD   100 mg at 01/06/15 2019   Current Outpatient Prescriptions  Medication Sig Dispense Refill  . ibuprofen (ADVIL,MOTRIN) 200 MG tablet Take 400 mg by mouth every 6 (six) hours as needed for headache or mild pain.      Musculoskeletal: Strength & Muscle Tone: within normal limits Gait & Station: normal Patient leans: N/A  Psychiatric Specialty Exam: Review of Systems  Constitutional: Negative.   HENT: Negative.   Eyes: Negative.   Respiratory: Negative.   Cardiovascular: Negative.   Gastrointestinal: Negative.   Musculoskeletal: Negative.   Skin: Negative.   Neurological: Negative.   Psychiatric/Behavioral: Positive for substance abuse. Negative for depression, suicidal ideas, hallucinations and memory loss. The patient is nervous/anxious and has insomnia.      Blood pressure 125/93, pulse 92, temperature 98.6 F (37 C), temperature source Oral, resp. rate 18, height 5' 6"  (1.676 m), weight 65.772 kg (145 lb), last menstrual period 01/03/2015, SpO2 100 %.Body mass index is 23.41 kg/(m^2).  General Appearance: Casual  Eye Contact::  Good  Speech:  Clear and Coherent  Volume:  Normal  Mood:  Anxious  Affect:  Congruent  Thought Process:  Coherent  Orientation:  Full (Time, Place, and Person)  Thought Content:  Negative  Suicidal Thoughts:  No  Homicidal Thoughts:  No  Memory:  Immediate;   Good Recent;   Good Remote;   Good  Judgement:  Fair  Insight:  Fair  Psychomotor Activity:  Normal  Concentration:  Fair  Recall:  [place] of Knowledge:Fair  Language: Fair  Akathisia:  No  Handed:  Right  AIMS (if indicated):     Assets:  Communication Skills Desire for Improvement Financial Resources/Insurance Housing Intimacy Leisure Time Physical Health Resilience Social Support  ADL's:  Intact  Cognition: WNL  Sleep:      Treatment Plan Summary: Plan Patient evaluated area she  was intoxicated yesterday but clinically appears to have sobered up at this point. I have asked to recheck her alcohol level but I don't anticipate it's going to be very high and it may be 0 at this point. Patient is lucid and is not expressing any suicidal or homicidal ideation. She does have a history of possible complex detox in the past but currently her vital signs are stable and she is not tremulous or showing any signs of delirium. Patient no longer meets commitment criteria. I gave her the option of going to [facility] observation for further detox and recommended that she consider it for maximum safety however she declines and would prefer to go home. Again since she is no longer committable I think that's unacceptable decision. Patient states that she intends to go to [facility] and reengage with treatment there. We reviewed the importance of not immediately  relapsing when she goes home and of returning to the emergency room if severe symptoms return. She agrees to the plan. Case reviewed with emergency room physician. Patient can be discharged with planned follow-up with local mental health and substance abuse treatment. As far as her mood symptoms and PTSD she is not acutely symptomatic at the moment and I will defer starting any medication for now but encouraged her to also discuss this with outpatient providers.  Disposition: No evidence of imminent risk to self or others at present.   Patient does not meet criteria for psychiatric inpatient admission. Supportive therapy provided about ongoing stressors. Refer to IOP. Discussed crisis plan, support from social network, calling 911, coming to the Emergency Department, and calling Suicide Hotline.  Nikitha Mode 01/07/2015 11:32 AM

## 2015-01-07 NOTE — ED Notes (Signed)
Pt. To EDBHU from ED ambulatory without difficulty, to room  . Report from RN Billey Changharles W., RN. Pt. Is alert and oriented, warm and dry in no distress. Pt. Denies SI, HI, and AVH. Pt. Calm and cooperative. Pt. Made aware of security cameras and Q15 minute rounds. Pt. Encouraged to let Nursing staff know of any concerns or needs.

## 2015-01-07 NOTE — ED Notes (Signed)
Shower completed  - she is sitting in a recliner in the dayroom - talking with another pt

## 2015-01-07 NOTE — ED Notes (Signed)
She is to be discharged to home with directions for outpt follow up   Lunch has been provided along with an extra drink  Pt observed with no unusual behavior  Appropriate to stimulation  No verbalized needs or concerns at this time  NAD assessed  Continue to monitor

## 2015-01-07 NOTE — ED Notes (Signed)
Pt. Noted in  room watching the tv.;. No complaints or concerns voiced. No distress or abnormal behavior noted. Will continue to monitor with security cameras. Q 15 minute rounds continue. 

## 2015-11-13 ENCOUNTER — Encounter: Payer: Self-pay | Admitting: Emergency Medicine

## 2015-11-13 ENCOUNTER — Emergency Department
Admission: EM | Admit: 2015-11-13 | Discharge: 2015-11-14 | Disposition: A | Discharge status: Home / Self Care | Payer: Self-pay | Attending: Emergency Medicine | Admitting: Emergency Medicine

## 2015-11-13 DIAGNOSIS — Z791 Long term (current) use of non-steroidal anti-inflammatories (NSAID): Secondary | ICD-10-CM | POA: Insufficient documentation

## 2015-11-13 DIAGNOSIS — X788XXA Intentional self-harm by other sharp object, initial encounter: Secondary | ICD-10-CM | POA: Insufficient documentation

## 2015-11-13 DIAGNOSIS — F329 Major depressive disorder, single episode, unspecified: Secondary | ICD-10-CM | POA: Insufficient documentation

## 2015-11-13 DIAGNOSIS — Z046 Encounter for general psychiatric examination, requested by authority: Secondary | ICD-10-CM

## 2015-11-13 DIAGNOSIS — X789XXA Intentional self-harm by unspecified sharp object, initial encounter: Secondary | ICD-10-CM

## 2015-11-13 DIAGNOSIS — S61519A Laceration without foreign body of unspecified wrist, initial encounter: Secondary | ICD-10-CM

## 2015-11-13 DIAGNOSIS — F1020 Alcohol dependence, uncomplicated: Secondary | ICD-10-CM | POA: Diagnosis present

## 2015-11-13 DIAGNOSIS — Z5181 Encounter for therapeutic drug level monitoring: Secondary | ICD-10-CM | POA: Insufficient documentation

## 2015-11-13 DIAGNOSIS — F10920 Alcohol use, unspecified with intoxication, uncomplicated: Secondary | ICD-10-CM

## 2015-11-13 DIAGNOSIS — F1010 Alcohol abuse, uncomplicated: Secondary | ICD-10-CM

## 2015-11-13 DIAGNOSIS — Z7289 Other problems related to lifestyle: Secondary | ICD-10-CM

## 2015-11-13 DIAGNOSIS — F10120 Alcohol abuse with intoxication, uncomplicated: Secondary | ICD-10-CM | POA: Insufficient documentation

## 2015-11-13 DIAGNOSIS — F32A Depression, unspecified: Secondary | ICD-10-CM

## 2015-11-13 DIAGNOSIS — F1994 Other psychoactive substance use, unspecified with psychoactive substance-induced mood disorder: Secondary | ICD-10-CM | POA: Diagnosis present

## 2015-11-13 NOTE — ED Triage Notes (Signed)
Pt brought in by [place] [employer] for cutting arms with razor, pt has hx of cutting, denies suicidal intent. States was arguing with boyfriend today, positive etoh.

## 2015-11-14 DIAGNOSIS — F1994 Other psychoactive substance use, unspecified with psychoactive substance-induced mood disorder: Secondary | ICD-10-CM

## 2015-11-14 DIAGNOSIS — X789XXA Intentional self-harm by unspecified sharp object, initial encounter: Secondary | ICD-10-CM

## 2015-11-14 DIAGNOSIS — S61519A Laceration without foreign body of unspecified wrist, initial encounter: Secondary | ICD-10-CM

## 2015-11-14 DIAGNOSIS — Z046 Encounter for general psychiatric examination, requested by authority: Secondary | ICD-10-CM

## 2015-11-14 LAB — URINE DRUG SCREEN, QUALITATIVE (ARMC ONLY)
AMPHETAMINES, UR SCREEN: NOT DETECTED
BARBITURATES, UR SCREEN: NOT DETECTED
BENZODIAZEPINE, UR SCRN: NOT DETECTED
Cannabinoid 50 Ng, Ur ~~LOC~~: NOT DETECTED
Cocaine Metabolite,Ur ~~LOC~~: NOT DETECTED
MDMA (Ecstasy)Ur Screen: NOT DETECTED
METHADONE SCREEN, URINE: NOT DETECTED
Opiate, Ur Screen: NOT DETECTED
Phencyclidine (PCP) Ur S: NOT DETECTED
TRICYCLIC, UR SCREEN: NOT DETECTED

## 2015-11-14 LAB — URINALYSIS COMPLETE WITH MICROSCOPIC (ARMC ONLY)
BILIRUBIN URINE: NEGATIVE
Glucose, UA: NEGATIVE mg/dL
Hgb urine dipstick: NEGATIVE
Ketones, ur: NEGATIVE mg/dL
LEUKOCYTES UA: NEGATIVE
Nitrite: NEGATIVE
PH: 5 (ref 5.0–8.0)
PROTEIN: NEGATIVE mg/dL
Specific Gravity, Urine: 1.003 — ABNORMAL LOW (ref 1.005–1.030)

## 2015-11-14 LAB — CBC
HEMATOCRIT: 30.9 % — AB (ref 35.0–47.0)
HEMOGLOBIN: 9.8 g/dL — AB (ref 12.0–16.0)
MCH: 22.4 pg — ABNORMAL LOW (ref 26.0–34.0)
MCHC: 31.9 g/dL — AB (ref 32.0–36.0)
MCV: 70.4 fL — ABNORMAL LOW (ref 80.0–100.0)
Platelets: 441 10*3/uL — ABNORMAL HIGH (ref 150–440)
RBC: 4.38 MIL/uL (ref 3.80–5.20)
RDW: 18.5 % — ABNORMAL HIGH (ref 11.5–14.5)
WBC: 6.6 10*3/uL (ref 3.6–11.0)

## 2015-11-14 LAB — SALICYLATE LEVEL

## 2015-11-14 LAB — PREGNANCY, URINE: Preg Test, Ur: NEGATIVE

## 2015-11-14 LAB — BASIC METABOLIC PANEL
Anion gap: 8 (ref 5–15)
BUN: 14 mg/dL (ref 6–20)
CO2: 22 mmol/L (ref 22–32)
CREATININE: 0.52 mg/dL (ref 0.44–1.00)
Calcium: 8.8 mg/dL — ABNORMAL LOW (ref 8.9–10.3)
Chloride: 109 mmol/L (ref 101–111)
GFR calc Af Amer: >60 mL/min (ref >60)
GLUCOSE: 88 mg/dL (ref 65–99)
POTASSIUM: 4 mmol/L (ref 3.5–5.1)
SODIUM: 139 mmol/L (ref 135–145)

## 2015-11-14 LAB — ACETAMINOPHEN LEVEL: Acetaminophen (Tylenol), Serum: <10 ug/mL — ABNORMAL LOW (ref 10–30)

## 2015-11-14 LAB — ETHANOL: Alcohol, Ethyl (B): 335 mg/dL (ref <5)

## 2015-11-14 MED ORDER — M.V.I. ADULT IV INJ
INJECTION | Freq: Once | INTRAVENOUS | Status: AC
  Administered 2015-11-14: 06:00:00 via INTRAVENOUS
  Filled 2015-11-14: qty 1000

## 2015-11-14 MED ORDER — VITAMIN B-1 100 MG PO TABS: 100.0000 mg | ORAL_TABLET | Freq: Every day | ORAL | Status: DC

## 2015-11-14 MED ORDER — SODIUM CHLORIDE 0.9 % IV BOLUS (SEPSIS)
1000.0000 mL | Freq: Once | INTRAVENOUS | Status: AC
  Administered 2015-11-14: 1000 mL via INTRAVENOUS

## 2015-11-14 MED ORDER — LORAZEPAM 2 MG/ML IJ SOLN: 0.0000 mg | Freq: Four times a day (QID) | INTRAMUSCULAR | Status: DC

## 2015-11-14 NOTE — ED Notes (Signed)
Report given to oncoming shift.

## 2015-11-14 NOTE — ED Notes (Signed)
Breakfast was given to patient. 

## 2015-11-14 NOTE — Consult Note (Signed)
[facility] Consult   Reason for Consult:  Consult for 40 year old woman with a history of alcohol abuse who came into the hospital after cutting her wrists while intoxicated Referring Physician:  Clearnce Hasten Patient Identification: Shannon Duffy MRN:  [id number] Principal Diagnosis: Substance induced mood disorder [facility]) Diagnosis:   Patient Active Problem List   Diagnosis Date Noted  . Self-inflicted laceration of wrist [S61.519A] 11/14/2015  . Involuntary commitment [Z04.6] 11/14/2015  . Alcohol withdrawal ([place]) [F10.239] 01/07/2015  . Substance induced mood disorder ([place]) [F19.94] 01/07/2015  . PTSD (post-traumatic stress disorder) [F43.10] 01/07/2015  . Alcohol use disorder, moderate, dependence ([place]) [F10.20] 09/21/2014    Total Time spent with patient: 1 hour  Subjective:   Shannon Duffy is a 40 y.o. female patient admitted with "I guess I was trying to get attention".  HPI:  Patient interviewed. Chart reviewed. Case discussed with emergency room doctor. Labs reviewed. 40 year old woman came to the hospital last night intoxicated with superficial scrapes to both of her wrists. Patient says that she was drinking last night. Consumed 2 of the 40 ounce bottles of beer. She says she drinks about 2 times a week. She thinks that overall she is actually been drinking a little bit less recently but that may be why the amount she had yesterday hit her so hard. She says that she got drunk and all she remembers is that she was trying to get attention from her boyfriend. She can't make any more sense of it than that. Remembers that she scratched her wrists. She feels certain she was not actually wanting to die. Patient says that she has not been feeling more depressed recently. Mood feels good. Most of the time she is sleeping well. Has no new physical problems. Denies suicidal or homicidal ideation. Denies any psychotic symptoms. She is not currently seeing anyone for  outpatient mental health treatment. She does not appear to be engaged in any kind of substance abuse treatment right now. She is not able to report a major stress except that her boyfriend's son lives at home which gets on her nerves.  Social history: Patient is living with her boyfriend and her boyfriend's son. Ounce like there is a fair bit of fussing between the 3 of them at home. Patient not working outside the home.  Medical history: Very superficial scratches to her arms required no further treatment. No other ongoing medical problems.  Substance abuse history: Long-standing history of alcohol abuse. Has intermittently had some involvement with substance abuse treatment. Not currently making any effort to stop. Denies any history of seizures or delirium tremens. Denies that she abuses any other drugs.  Past Psychiatric History: Past history of PTSD. She's had psychiatric hospitalizations as well and the past although she denies that she's ever really tried to kill her self. She says she's only hurt herself when she was intoxicated. Not currently taking any medicine. Denies any history of violence to others. Minimizes all of her psychiatric history.  Risk to Self: Suicidal Ideation: No Suicidal Intent: No Is patient at risk for suicide?: No Suicidal Plan?: No Access to Means: Yes Specify Access to Suicidal Means: patient has access to razors What has been your use of drugs/alcohol within the last 12 months?: alcohol Other Self Harm Risks: none disclosed by patient Triggers for Past Attempts: None known Intentional Self Injurious Behavior: Cutting Comment - Self Injurious Behavior: Pt presents with numerous lacerations on her forearm. Risk to Others: Homicidal Ideation: No Thoughts of Harm  to Others: No Current Homicidal Intent: No Current Homicidal Plan: No Access to Homicidal Means: No Identified Victim: none identified History of harm to others?: No Assessment of Violence: None  Noted Violent Behavior Description: none identified Does patient have access to weapons?: No Criminal Charges Pending?: No Does patient have a court date: No Prior Inpatient Therapy: Prior Inpatient Therapy: Yes Prior Therapy Dates: 2016 Prior Therapy Facilty/Provider(s): [facility]; [place] Reason for Treatment: alcohol abuse Prior Outpatient Therapy: Prior Outpatient Therapy: No Prior Therapy Dates: na Prior Therapy Facilty/Provider(s): na Reason for Treatment: na Does patient have an ACCT team?: No Does patient have Intensive In-House Services?  : No Does patient have [facility] services? : No Does patient have P4CC services?: No  Past Medical History:  Past Medical History:  Diagnosis Date  . Alcohol abuse   . Panic     Past Surgical History:  Procedure Laterality Date  . CESAREAN SECTION     Family History: No family history on file. Family Psychiatric  History: Says there is depression and anxiety in several people in her family Social History:  History  Alcohol Use  . Yes    Comment: 6 40 oz beers / day     History  Drug Use No    Social History   Social History  . Marital status: Single    Spouse name: N/A  . Number of children: N/A  . Years of education: N/A   Social History Main Topics  . Smoking status: Never Smoker  . Smokeless tobacco: Not on file  . Alcohol use Yes     Comment: 6 40 oz beers / day  . Drug use: No  . Sexual activity: Not on file   Other Topics Concern  . Not on file   Social History Narrative  . No narrative on file   Additional Social History:    Allergies:  No Known Allergies  Labs:  Results for orders placed or performed during the hospital encounter of 11/13/15 (from the past 48 hour(s))  CBC     Status: Abnormal   Collection Time: 11/13/15 11:47 PM  Result Value Ref Range   WBC 6.6 3.6 - 11.0 K/uL   RBC 4.38 3.80 - 5.20 MIL/uL   Hemoglobin 9.8 (L) 12.0 - 16.0 g/dL   HCT 30.9 (L) 35.0 - 47.0 %   MCV 70.4 (L) 80.0  - 100.0 fL   MCH 22.4 (L) 26.0 - 34.0 pg   MCHC 31.9 (L) 32.0 - 36.0 g/dL   RDW 18.5 (H) 11.5 - 14.5 %   Platelets 441 (H) 150 - 440 K/uL  Basic metabolic panel     Status: Abnormal   Collection Time: 11/13/15 11:47 PM  Result Value Ref Range   Sodium 139 135 - 145 mmol/L   Potassium 4.0 3.5 - 5.1 mmol/L   Chloride 109 101 - 111 mmol/L   CO2 22 22 - 32 mmol/L   Glucose, Bld 88 65 - 99 mg/dL   BUN 14 6 - 20 mg/dL   Creatinine, Ser 0.52 0.44 - 1.00 mg/dL   Calcium 8.8 (L) 8.9 - 10.3 mg/dL   GFR calc non Af Amer >60 >60 mL/min   GFR calc Af Amer >60 >60 mL/min    Comment: (NOTE) The eGFR has been calculated using the CKD EPI equation. This calculation has not been validated in all clinical situations. eGFR's persistently <60 mL/min signify possible Chronic Kidney Disease.    Anion gap 8 5 - 15  Ethanol     Status: Abnormal   Collection Time: 11/13/15 11:47 PM  Result Value Ref Range   Alcohol, Ethyl (B) 335 (HH) <5 mg/dL    Comment: CRITICAL RESULT CALLED TO, READ BACK BY AND VERIFIED WITH IRIS GUIDRY ON 11/14/15 AT 0106 BY TLB        LOWEST DETECTABLE LIMIT FOR SERUM ALCOHOL IS 5 mg/dL FOR MEDICAL PURPOSES ONLY   Pregnancy, urine     Status: None   Collection Time: 11/13/15 11:47 PM  Result Value Ref Range   Preg Test, Ur NEGATIVE NEGATIVE  Urine Drug Screen, Qualitative ([facility] only)     Status: None   Collection Time: 11/13/15 11:47 PM  Result Value Ref Range   Tricyclic, Ur Screen NONE DETECTED NONE DETECTED   Amphetamines, Ur Screen NONE DETECTED NONE DETECTED   MDMA (Ecstasy)Ur Screen NONE DETECTED NONE DETECTED   Cocaine Metabolite,Ur [place] NONE DETECTED NONE DETECTED   Opiate, Ur Screen NONE DETECTED NONE DETECTED   Phencyclidine (PCP) Ur S NONE DETECTED NONE DETECTED   Cannabinoid 50 Ng, Ur [place] NONE DETECTED NONE DETECTED   Barbiturates, Ur Screen NONE DETECTED NONE DETECTED   Benzodiazepine, Ur Scrn NONE DETECTED NONE DETECTED   Methadone Scn, Ur NONE DETECTED NONE  DETECTED    Comment: (NOTE) 932  Tricyclics, urine               Cutoff 1000 ng/mL 200  Amphetamines, urine             Cutoff 1000 ng/mL 300  MDMA (Ecstasy), urine           Cutoff 500 ng/mL 400  Cocaine Metabolite, urine       Cutoff 300 ng/mL 500  Opiate, urine                   Cutoff 300 ng/mL 600  Phencyclidine (PCP), urine      Cutoff 25 ng/mL 700  Cannabinoid, urine              Cutoff 50 ng/mL 800  Barbiturates, urine             Cutoff 200 ng/mL 900  Benzodiazepine, urine           Cutoff 200 ng/mL 1000 Methadone, urine                Cutoff 300 ng/mL 1100 1200 The urine drug screen provides only a preliminary, unconfirmed 1300 analytical test result and should not be used for non-medical 1400 purposes. Clinical consideration and professional judgment should 1500 be applied to any positive drug screen result due to possible 1600 interfering substances. A more specific alternate chemical method 1700 must be used in order to obtain a confirmed analytical result.  1800 Gas chromato graphy / mass spectrometry (GC/MS) is the preferred 1900 confirmatory method.   Acetaminophen level     Status: Abnormal   Collection Time: 11/13/15 11:47 PM  Result Value Ref Range   Acetaminophen (Tylenol), Serum <10 (L) 10 - 30 ug/mL    Comment:        THERAPEUTIC CONCENTRATIONS VARY SIGNIFICANTLY. A RANGE OF 10-30 ug/mL MAY BE AN EFFECTIVE CONCENTRATION FOR MANY PATIENTS. HOWEVER, SOME ARE BEST TREATED AT CONCENTRATIONS OUTSIDE THIS RANGE. ACETAMINOPHEN CONCENTRATIONS >150 ug/mL AT 4 HOURS AFTER INGESTION AND >50 ug/mL AT 12 HOURS AFTER INGESTION ARE OFTEN ASSOCIATED WITH TOXIC REACTIONS.   Salicylate level     Status: None   Collection Time: 11/13/15 11:47 PM  Result Value Ref Range   Salicylate Lvl <4.8 2.8 - 30.0 mg/dL  Urinalysis complete, with microscopic ([facility] only)     Status: Abnormal   Collection Time: 11/13/15 11:49 PM  Result Value Ref Range   Color, Urine COLORLESS  (A) YELLOW   APPearance CLEAR (A) CLEAR   Glucose, UA NEGATIVE NEGATIVE mg/dL   Bilirubin Urine NEGATIVE NEGATIVE   Ketones, ur NEGATIVE NEGATIVE mg/dL   Specific Gravity, Urine 1.003 (L) 1.005 - 1.030   Hgb urine dipstick NEGATIVE NEGATIVE   pH 5.0 5.0 - 8.0   Protein, ur NEGATIVE NEGATIVE mg/dL   Nitrite NEGATIVE NEGATIVE   Leukocytes, UA NEGATIVE NEGATIVE   RBC / HPF 0-5 0 - 5 RBC/hpf   WBC, UA 0-5 0 - 5 WBC/hpf   Bacteria, UA RARE (A) NONE SEEN   Squamous Epithelial / LPF 0-5 (A) NONE SEEN    Current Facility-Administered Medications  Medication Dose Route Frequency Provider Last Rate Last Dose  . LORazepam (ATIVAN) injection 0-4 mg  0-4 mg Intravenous Q6H Paulette Blanch, MD      . thiamine (VITAMIN B-1) tablet 100 mg  100 mg Oral Daily Paulette Blanch, MD       Current Outpatient Prescriptions  Medication Sig Dispense Refill  . ibuprofen (ADVIL,MOTRIN) 200 MG tablet Take 400 mg by mouth every 6 (six) hours as needed for headache or mild pain.      Musculoskeletal: Strength & Muscle Tone: within normal limits Gait & Station: normal Patient leans: N/A  Psychiatric Specialty Exam: Physical Exam  Nursing note and vitals reviewed. Constitutional: She appears well-developed and well-nourished.  HENT:  Head: Normocephalic and atraumatic.  Eyes: Conjunctivae are normal. Pupils are equal, round, and reactive to light.  Neck: Normal range of motion.  Cardiovascular: Regular rhythm and normal heart sounds.   Respiratory: Effort normal. No respiratory distress.  GI: Soft.  Musculoskeletal: Normal range of motion.  Neurological: She is alert.  Skin: Skin is warm and dry.  Psychiatric: She has a normal mood and affect. Her speech is normal and behavior is normal. Judgment and thought content normal. Cognition and memory are normal.    Review of Systems  Constitutional: Negative.   HENT: Negative.   Eyes: Negative.   Respiratory: Negative.   Cardiovascular: Negative.    Gastrointestinal: Negative.   Musculoskeletal: Negative.   Skin: Negative.   Neurological: Negative.   Psychiatric/Behavioral: Positive for substance abuse. Negative for depression, hallucinations, memory loss and suicidal ideas. The patient is not nervous/anxious and does not have insomnia.     Blood pressure (!) 150/75, pulse 86, temperature 98 F (36.7 C), temperature source Oral, resp. rate 18, height 5' 6"  (1.676 m), weight 67.1 kg (148 lb), last menstrual period 10/23/2015, SpO2 97 %.Body mass index is 23.89 kg/m.  General Appearance: Fairly Groomed  Eye Contact:  Good  Speech:  Clear and Coherent  Volume:  Normal  Mood:  Negative  Affect:  Appropriate  Thought Process:  Goal Directed  Orientation:  Full (Time, Place, and Person)  Thought Content:  Logical  Suicidal Thoughts:  No  Homicidal Thoughts:  No  Memory:  Immediate;   Good Recent;   Fair Remote;   Fair  Judgement:  Fair  Insight:  Fair  Psychomotor Activity:  Normal  Concentration:  Concentration: Fair  Recall:  [employer] of Knowledge:  Fair  Language:  Fair  Akathisia:  No  Handed:  Right  AIMS (if indicated):  Assets:  Communication Skills Desire for Improvement Financial Resources/Insurance Housing Social Support  ADL's:  Intact  Cognition:  WNL  Sleep:        Treatment Plan Summary: Plan 40 year old woman who was intoxicated last night. Sobered up today. Slightly high blood pressure but no tremor and no sign of delirium. Patient is not depressed denies depressive symptoms denies suicidal ideation. Does not require hospital treatment and does not meet commitment criteria. Discontinue IVC. No medications indicated. Patient educated and counseled about how her abuse of alcohol is dangerous to her and is strongly encouraged to go back to [place] again and get involved in treatment. Case reviewed with emergency room physician. Patient can be discharged from the emergency room.  Disposition: Patient does  not meet criteria for psychiatric inpatient admission.  Alethia Berthold, MD 11/14/2015 3:14 PM

## 2015-11-14 NOTE — ED Provider Notes (Signed)
[facility] Emergency Department Provider Note   ____________________________________________   First MD Initiated Contact with Patient 11/14/15 0050     (approximate)  I have reviewed the triage vital signs and the nursing notes.   HISTORY  Chief Complaint Depression    HPI Shannon Duffy is a 40 y.o. female with a history of alcohol dependency, substance use mood disorder and PTSD who is brought to the ED under IVC by police for cutting her arms with razor. Patient is intoxicated, states she was arguing with her boyfriend today and denies active SI/HI/AH/VH. She is tearful and upset and desires to leave immediately. Voices no medical complaints. Specifically, denies recent fever, chills, chest pain, shortness of breath, abdominal pain, nausea, vomiting, dysuria, diarrhea. Tetanus is up-to-date. Denies recent travel or trauma. Nothing makes her symptoms better or worse.   Past Medical History:  Diagnosis Date  . Alcohol abuse   . Panic     Patient Active Problem List   Diagnosis Date Noted  . Alcohol withdrawal (HCC) 01/07/2015  . Substance induced mood disorder (H[facility]) 01/07/2015  . PTSD (post-traumatic stress disorder) 01/07/2015  . Alcohol use disorder, moderate, dependence ([facility]) 09/21/2014    Past Surgical History:  Procedure Laterality Date  . CESAREAN SECTION      Prior to Admission medications   Medication Sig Start Date End Date Taking? Authorizing Provider  ibuprofen (ADVIL,MOTRIN) 200 MG tablet Take 400 mg by mouth every 6 (six) hours as needed for headache or mild pain.    Historical Provider, MD    Allergies Review of patient's allergies indicates no known allergies.  No family history on file.  Social History Social History  Substance Use Topics  . Smoking status: Never Smoker  . Smokeless tobacco: Not on file  . Alcohol use Yes     Comment: 6 40 oz beers / day    Review of Systems  Constitutional: No  fever/chills. Eyes: No visual changes. ENT: No sore throat. Cardiovascular: Denies chest pain. Respiratory: Denies shortness of breath. Gastrointestinal: No abdominal pain.  No nausea, no vomiting.  No diarrhea.  No constipation. Genitourinary: Negative for dysuria. Musculoskeletal: Negative for back pain. Skin: Negative for rash. Neurological: Negative for headaches, focal weakness or numbness. Psychiatric:Positive for depression and self-injurious behavior.  10-point ROS otherwise negative.  ____________________________________________   PHYSICAL EXAM:  VITAL SIGNS: ED Triage Vitals  Enc Vitals Group     BP 11/13/15 2346 (!) 128/98     Pulse Rate 11/13/15 2346 98     Resp 11/13/15 2346 18     Temp 11/13/15 2346 98.1 F (36.7 C)     Temp Source 11/13/15 2346 Oral     SpO2 11/13/15 2346 100 %     Weight 11/13/15 2344 148 lb (67.1 kg)     Height 11/13/15 2344 5\' 6"  (1.676 m)     Head Circumference --      Peak Flow --      Pain Score --      Pain Loc --      Pain Edu? --      Excl. in GC? --     Constitutional: Alert and oriented. Well appearing and in no acute distress.Intoxicated. Eyes: Conjunctivae are normal. PERRL. EOMI. Head: Atraumatic. Nose: No congestion/rhinnorhea. Mouth/Throat: Mucous membranes are moist.  Oropharynx non-erythematous. Neck: No stridor.   Cardiovascular: Normal rate, regular rhythm. Grossly normal heart sounds.  Good peripheral circulation. Respiratory: Normal respiratory effort.  No retractions. Lungs CTAB.  Gastrointestinal: Soft and nontender. No distention. No abdominal bruits. No CVA tenderness. Musculoskeletal: Bilateral forearms with multiple, linear, superficial, nonbleeding lacerations which do not require suture repair. Neurologic:  Normal speech and language. No gross focal neurologic deficits are appreciated. No gait instability. Skin:  Skin is warm, dry and intact. No rash noted. Psychiatric: Mood and affect are tearful, flat.  Speech and behavior are normal.  ____________________________________________   LABS (all labs ordered are listed, but only abnormal results are displayed)  Labs Reviewed  CBC - Abnormal; Notable for the following:       Result Value   Hemoglobin 9.8 (*)    HCT 30.9 (*)    MCV 70.4 (*)    MCH 22.4 (*)    MCHC 31.9 (*)    RDW 18.5 (*)    Platelets 441 (*)    All other components within normal limits  BASIC METABOLIC PANEL - Abnormal; Notable for the following:    Calcium 8.8 (*)    All other components within normal limits  ETHANOL - Abnormal; Notable for the following:    Alcohol, Ethyl (B) 335 (*)    All other components within normal limits  URINALYSIS COMPLETEWITH MICROSCOPIC ([facility] ONLY) - Abnormal; Notable for the following:    Color, Urine COLORLESS (*)    APPearance CLEAR (*)    Specific Gravity, Urine 1.003 (*)    Bacteria, UA RARE (*)    Squamous Epithelial / LPF 0-5 (*)    All other components within normal limits  ACETAMINOPHEN LEVEL - Abnormal; Notable for the following:    Acetaminophen (Tylenol), Serum <10 (*)    All other components within normal limits  PREGNANCY, URINE  URINE DRUG SCREEN, QUALITATIVE ([facility] ONLY)  SALICYLATE LEVEL   ____________________________________________  EKG  None ____________________________________________  RADIOLOGY  None ____________________________________________   PROCEDURES  Procedure(s) performed: None  Procedures  Critical Care performed: No  ____________________________________________   INITIAL IMPRESSION / ASSESSMENT AND PLAN / ED COURSE  Pertinent labs & imaging results that were available during my care of the patient were reviewed by me and considered in my medical decision making (see chart for details).  40 year old female who presents under IVC for depression and self-injurious behavior. Laboratory and urinalysis results notable for elevated EtOH. Will place patient under CIWA protocol,  infuse IV fluids. Nursing to apply bacitracin ointment and dress wounds on bilateral forearms. Patient will remain in the emergency department under IVC pending TTS and psychiatry evaluations.  Clinical Course  Comment By Time  No further events overnight. Patient remains in the emergency department under IVC pending psychiatry evaluation this morning. Irean Hong, MD 08/25 [phone]     ____________________________________________   FINAL CLINICAL IMPRESSION(S) / ED DIAGNOSES  Final diagnoses:  Depression  Self-mutilation  Alcohol abuse  Alcohol intoxication, uncomplicated (HCC)      NEW MEDICATIONS STARTED DURING THIS VISIT:  New Prescriptions   No medications on file     Note:  This document was prepared using Dragon voice recognition software and may include unintentional dictation errors.    Irean Hong, MD 11/14/15 [phone]

## 2015-11-14 NOTE — ED Notes (Signed)
Sherilyn CooterHenry RN called pharmacy to find out if they have sent the Pt's banana bag/IV fluids. Medication was not sent.

## 2015-11-14 NOTE — BH Assessment (Signed)
Assessment Note  Shannon Duffy is an 40 y.o. female presenting to the ED under IVC, initiated by her boyfriend.  Pt has a history of alcohol dependency, substance use mood disorder and PTSD.  Patient reportedly got into an argument with her boyfriend and went into the bathroom and cut her arms with a razor. Pt reports she cut herself to get her boyfriend's attention.  She denies SI and says that cutting herself is part of her "attention seeking" behavior.  She denies HI and any auditory/visual hallucinations.  She admits to drinking tonight.  BAC=335.  She denies any other drug use.  Diagnosis: Alcohol Intoxication  Past Medical History:  Past Medical History:  Diagnosis Date  . Alcohol abuse   . Panic     Past Surgical History:  Procedure Laterality Date  . CESAREAN SECTION      Family History: No family history on file.  Social History:  reports that she has never smoked. She does not have any smokeless tobacco history on file. She reports that she drinks alcohol. She reports that she does not use drugs.  Additional Social History:  Alcohol / Drug Use History of alcohol / drug use?: Yes (Pt has an extensive history of alcohol abuse.) Longest period of sobriety (when/how long): 2014 Negative Consequences of Use: Personal relationships Withdrawal Symptoms: Agitation, Irritability  CIWA: CIWA-Ar BP: (!) 128/98 Pulse Rate: 98 COWS:    Allergies: No Known Allergies  Home Medications:  (Not in a hospital admission)  OB/GYN Status:  Patient's last menstrual period was 10/23/2015.  General Assessment Data [facility] Assessment: In system Is this a Tele or Face-to-Face Assessment?: Face-to-Face Is this an Initial Assessment or a Re-assessment for this encounter?: Initial Assessment Marital status: Single Maiden name: na Is patient pregnant?: No Pregnancy Status: No Living Arrangements: Spouse/significant other Can pt return to current living  arrangement?: Yes Admission Status: Involuntary Is patient capable of signing voluntary admission?: Yes Referral Source: Self/Family/Friend Insurance type: self pay  Medical Screening Exam [facility]-in ONLY) Medical Exam completed: Yes  Crisis Care Plan Living Arrangements: Spouse/significant other Legal Guardian: Other: (self) Name of Psychiatrist: none reported Name of Therapist: none reported  Education Status Is patient currently in school?: No Current Grade: na Highest grade of school patient has completed: na Name of school: na Contact person: na  Risk to self with the past 6 months Suicidal Ideation: No Has patient been a risk to self within the past 6 months prior to admission? : No Suicidal Intent: No Has patient had any suicidal intent within the past 6 months prior to admission? : No Is patient at risk for suicide?: No Suicidal Plan?: No Has patient had any suicidal plan within the past 6 months prior to admission? : No Access to Means: Yes Specify Access to Suicidal Means: patient has access to razors What has been your use of drugs/alcohol within the last 12 months?: alcohol Previous Attempts/Gestures: No Other Self Harm Risks: none disclosed by patient Triggers for Past Attempts: None known Intentional Self Injurious Behavior: Cutting Comment - Self Injurious Behavior: Pt presents with numerous lacerations on her forearm. Family Suicide History: No Recent stressful life event(s): Conflict (Comment) (conflict with significant other) Persecutory voices/beliefs?: No Depression: Yes Depression Symptoms: Tearfulness, Guilt, Feeling worthless/self pity, Feeling angry/irritable Substance abuse history and/or treatment for substance abuse?: Yes Suicide prevention information given to non-admitted patients: Not applicable  Risk to Others within the past 6 months Homicidal Ideation: No Does  patient have any lifetime risk of violence toward others beyond the six  months prior to admission? : No Thoughts of Harm to Others: No Current Homicidal Intent: No Current Homicidal Plan: No Access to Homicidal Means: No Identified Victim: none identified History of harm to others?: No Assessment of Violence: None Noted Violent Behavior Description: none identified Does patient have access to weapons?: No Criminal Charges Pending?: No Does patient have a court date: No Is patient on probation?: No  Psychosis Hallucinations: None noted Delusions: None noted  Mental Status Report Appearance/Hygiene: In scrubs Eye Contact: Fair Motor Activity: Freedom of movement Speech: Logical/coherent, Argumentative Level of Consciousness: Alert Mood: Depressed Affect: Appropriate to circumstance, Depressed Anxiety Level: Minimal Thought Processes: Coherent, Relevant Judgement: Partial Orientation: Person, Place, Time, Situation Obsessive Compulsive Thoughts/Behaviors: None  Cognitive Functioning Concentration: Normal Memory: Recent Intact, Remote Intact IQ: Average Insight: Poor Impulse Control: Poor Appetite: Fair Weight Loss: 0 Weight Gain: 0 Sleep: No Change Vegetative Symptoms: None  ADLScreening [facility] Assessment Services) Patient's cognitive ability adequate to safely complete daily activities?: Yes Patient able to express need for assistance with ADLs?: Yes Independently performs ADLs?: Yes (appropriate for developmental age)  Prior Inpatient Therapy Prior Inpatient Therapy: Yes Prior Therapy Dates: 2016 Prior Therapy Facilty/Provider(s): [facility]; [facility] Reason for Treatment: alcohol abuse  Prior Outpatient Therapy Prior Outpatient Therapy: No Prior Therapy Dates: na Prior Therapy Facilty/Provider(s): na Reason for Treatment: na Does patient have an ACCT team?: No Does patient have Intensive In-House Services?  : No Does patient have [facility] services? : No Does patient have P4CC services?: No  ADL Screening (condition at time of  admission) Patient's cognitive ability adequate to safely complete daily activities?: Yes Patient able to express need for assistance with ADLs?: Yes Independently performs ADLs?: Yes (appropriate for developmental age)       Abuse/Neglect Assessment (Assessment to be complete while patient is alone) Physical Abuse: Denies Verbal Abuse: Denies Sexual Abuse: Denies Exploitation of patient/patient's resources: Denies Self-Neglect: Denies Values / Beliefs Cultural Requests During Hospitalization: None Spiritual Requests During Hospitalization: None Consults Spiritual Care Consult Needed: No Social Work Consult Needed: No      Additional Information 1:1 In Past 12 Months?: No CIRT Risk: No Elopement Risk: No Does patient have medical clearance?: Yes     Disposition:  Disposition Initial Assessment Completed for this Encounter: Yes Disposition of Patient: Other dispositions Other disposition(s): Other (Comment) (Pending Psych Md consult)  On Site Evaluation by:   Reviewed with Physician:    [place] 11/14/2015 2:37 AM

## 2015-11-14 NOTE — ED Provider Notes (Signed)
-----------------------------------------   3:30 PM on 11/14/2015 -----------------------------------------   Blood pressure (!) 150/75, pulse 86, temperature 98 F (36.7 C), temperature source Oral, resp. rate 18, height 5\' 6"  (1.676 m), weight 148 lb (67.1 kg), last menstrual period 10/23/2015, SpO2 97 %.  The patient had no acute events since last update.  Calm and cooperative at this time.  Evaluated by Dr. Toni Amendlapacs of psychiatry who gives a patient of Britta MccreedyBarbara for discharge and follow-up as an outpatient with [facility] where she has been seen in the past. Diagnosis of alcohol induced mood disorder per psychiatry without any suicidal ideation at this time.    Myrna Blazeravid Matthew Ellison Rieth, MD 11/14/15 [id number]

## 2015-11-14 NOTE — ED Notes (Signed)
Patient in bathroom at this time.

## 2015-11-25 ENCOUNTER — Encounter: Payer: Self-pay | Admitting: Emergency Medicine

## 2015-11-25 ENCOUNTER — Emergency Department
Admission: EM | Admit: 2015-11-25 | Discharge: 2015-11-25 | Disposition: A | Discharge status: Home / Self Care | Payer: Self-pay | Attending: Emergency Medicine | Admitting: Emergency Medicine

## 2015-11-25 DIAGNOSIS — Z5181 Encounter for therapeutic drug level monitoring: Secondary | ICD-10-CM | POA: Insufficient documentation

## 2015-11-25 DIAGNOSIS — Z046 Encounter for general psychiatric examination, requested by authority: Secondary | ICD-10-CM

## 2015-11-25 DIAGNOSIS — F1010 Alcohol abuse, uncomplicated: Secondary | ICD-10-CM | POA: Insufficient documentation

## 2015-11-25 DIAGNOSIS — F1994 Other psychoactive substance use, unspecified with psychoactive substance-induced mood disorder: Secondary | ICD-10-CM | POA: Diagnosis present

## 2015-11-25 DIAGNOSIS — F1020 Alcohol dependence, uncomplicated: Secondary | ICD-10-CM | POA: Diagnosis present

## 2015-11-25 DIAGNOSIS — R45851 Suicidal ideations: Secondary | ICD-10-CM

## 2015-11-25 LAB — CBC
HCT: 31 % — ABNORMAL LOW (ref 35.0–47.0)
Hemoglobin: 9.9 g/dL — ABNORMAL LOW (ref 12.0–16.0)
MCH: 22.3 pg — ABNORMAL LOW (ref 26.0–34.0)
MCHC: 31.8 g/dL — ABNORMAL LOW (ref 32.0–36.0)
MCV: 70.1 fL — ABNORMAL LOW (ref 80.0–100.0)
PLATELETS: 310 10*3/uL (ref 150–440)
RBC: 4.42 MIL/uL (ref 3.80–5.20)
RDW: 18.2 % — ABNORMAL HIGH (ref 11.5–14.5)
WBC: 4.8 10*3/uL (ref 3.6–11.0)

## 2015-11-25 LAB — URINE DRUG SCREEN, QUALITATIVE (ARMC ONLY)
AMPHETAMINES, UR SCREEN: NOT DETECTED
BENZODIAZEPINE, UR SCRN: NOT DETECTED
Barbiturates, Ur Screen: NOT DETECTED
Cannabinoid 50 Ng, Ur ~~LOC~~: NOT DETECTED
Cocaine Metabolite,Ur ~~LOC~~: NOT DETECTED
MDMA (ECSTASY) UR SCREEN: NOT DETECTED
METHADONE SCREEN, URINE: NOT DETECTED
Opiate, Ur Screen: NOT DETECTED
Phencyclidine (PCP) Ur S: NOT DETECTED
TRICYCLIC, UR SCREEN: NOT DETECTED

## 2015-11-25 LAB — COMPREHENSIVE METABOLIC PANEL
ALT: 10 U/L — ABNORMAL LOW (ref 14–54)
ANION GAP: 7 (ref 5–15)
AST: 14 U/L — AB (ref 15–41)
Albumin: 4.2 g/dL (ref 3.5–5.0)
Alkaline Phosphatase: 50 U/L (ref 38–126)
BUN: 12 mg/dL (ref 6–20)
CHLORIDE: 110 mmol/L (ref 101–111)
CO2: 24 mmol/L (ref 22–32)
Calcium: 8.6 mg/dL — ABNORMAL LOW (ref 8.9–10.3)
Creatinine, Ser: 0.59 mg/dL (ref 0.44–1.00)
Glucose, Bld: 97 mg/dL (ref 65–99)
POTASSIUM: 4 mmol/L (ref 3.5–5.1)
Sodium: 141 mmol/L (ref 135–145)
Total Bilirubin: 0.1 mg/dL — ABNORMAL LOW (ref 0.3–1.2)
Total Protein: 7.9 g/dL (ref 6.5–8.1)

## 2015-11-25 LAB — ACETAMINOPHEN LEVEL

## 2015-11-25 LAB — SALICYLATE LEVEL

## 2015-11-25 LAB — POCT PREGNANCY, URINE: PREG TEST UR: NEGATIVE

## 2015-11-25 LAB — ETHANOL: ALCOHOL ETHYL (B): 321 mg/dL — AB (ref <5)

## 2015-11-25 NOTE — ED Notes (Signed)
Pt walked out with steady gait to waiting room to wait for ride. Ride contacted by pt before discharge.

## 2015-11-25 NOTE — Consult Note (Signed)
[facility] Consult   Reason for Consult:  Consult for 40 year old woman brought in under IVC because of ongoing alcohol abuse Referring Physician:  Joni Duffy Patient Identification: [name] MRN:  [id number] Principal Diagnosis: Substance induced mood disorder [facility]) Diagnosis:   Patient Active Problem List   Diagnosis Date Noted  . Self-inflicted laceration of wrist [S61.519A] 11/14/2015  . Involuntary commitment [Z04.6] 11/14/2015  . Alcohol withdrawal ([place]) [F10.239] 01/07/2015  . Substance induced mood disorder ([name]) [F19.94] 01/07/2015  . PTSD (post-traumatic stress disorder) [F43.10] 01/07/2015  . Alcohol use disorder, moderate, dependence ([name]) [F10.20] 09/21/2014    Total Time spent with patient: 1 hour  Subjective:   Shannon Duffy is a 40 y.o. female patient admitted with "I really don't know why I'm here".  HPI:  Patient seen. Chart reviewed. Labs and vitals reviewed. This is a 40 year old woman who was brought in under involuntary commitment filed by a friend of hers. Patient had a blood alcohol level over 300 on presentation. History reported that she had made suicidal and homicidal statements. Since being in the emergency room patient has denied any memory of that. During my interview today she says that a friend of hers invited her to come over to his house. She spent a few hours hanging out there and then the police were called. She claims to have no memory of why that was. The referral paperwork indicates that she may have made statements about wishing she were dead or hurting herself or hurting her boyfriend. There is no evidence that she acted on any of that. No evidence that she did anything to hurt her boyfriend. No evidence that she's done anything to try and hurt her self since she was here about 10 days ago. She denies being aware of any suicidal thoughts. She admits that she has continued to drink regularly. She says that today she probably  drank a 40 ounce beer and also 2 large cups of wine. Using any other drugs. She has not been going to get any kind of outpatient psychiatric or substance abuse treatment.  Social history: Lives with her boyfriend. Has children living at home as well. Not working outside the home. Not much of activity. Seems to have some chronic problems with her boyfriend.  Medical history: Had lacerations to her wrists 10 days ago. No evidence that she's done anything again since then. No other ongoing medical problems.  Substance abuse history: As documented previously she has been drinking heavily. No history of DTs or seizures. Mood gets depressed apparently and labile when intoxicated. Hasn't really engaged appropriately in any substance abuse treatment for any length of time.  Past Psychiatric History: Patient has had previous visits to the emergency room most recently just about 10 days ago but was discharged at that time. History of PTSD. History of substance-induced mood disorder. History of self-mutilation of her wrist. Has not been following up with outpatient treatment although she has been referred to [facility] in the past.  Risk to Self: Is patient at risk for suicide?: Yes Risk to Others:   Prior Inpatient Therapy:   Prior Outpatient Therapy:    Past Medical History:  Past Medical History:  Diagnosis Date  . Alcohol abuse   . Panic     Past Surgical History:  Procedure Laterality Date  . CESAREAN SECTION     Family History: History reviewed. No pertinent family history. Family Psychiatric  History: Family history is reported to be negative. Social History:  History  Alcohol Use  . Yes    Comment: 6 40 oz beers / day     History  Drug Use No    Social History   Social History  . Marital status: Single    Spouse name: N/A  . Number of children: N/A  . Years of education: N/A   Social History Main Topics  . Smoking status: Never Smoker  . Smokeless tobacco: Never Used  . Alcohol  use Yes     Comment: 6 40 oz beers / day  . Drug use: No  . Sexual activity: Not Asked   Other Topics Concern  . None   Social History Narrative  . None   Additional Social History:    Allergies:  No Known Allergies  Labs:  Results for orders placed or performed during the hospital encounter of 11/25/15 (from the past 48 hour(s))  Urine Drug Screen, Qualitative     Status: None   Collection Time: 11/25/15  4:22 PM  Result Value Ref Range   Tricyclic, Ur Screen NONE DETECTED NONE DETECTED   Amphetamines, Ur Screen NONE DETECTED NONE DETECTED   MDMA (Ecstasy)Ur Screen NONE DETECTED NONE DETECTED   Cocaine Metabolite,Ur [place] NONE DETECTED NONE DETECTED   Opiate, Ur Screen NONE DETECTED NONE DETECTED   Phencyclidine (PCP) Ur S NONE DETECTED NONE DETECTED   Cannabinoid 50 Ng, Ur [place] NONE DETECTED NONE DETECTED   Barbiturates, Ur Screen NONE DETECTED NONE DETECTED   Benzodiazepine, Ur Scrn NONE DETECTED NONE DETECTED   Methadone Scn, Ur NONE DETECTED NONE DETECTED    Comment: (NOTE) 540  Tricyclics, urine               Cutoff 1000 ng/mL 200  Amphetamines, urine             Cutoff 1000 ng/mL 300  MDMA (Ecstasy), urine           Cutoff 500 ng/mL 400  Cocaine Metabolite, urine       Cutoff 300 ng/mL 500  Opiate, urine                   Cutoff 300 ng/mL 600  Phencyclidine (PCP), urine      Cutoff 25 ng/mL 700  Cannabinoid, urine              Cutoff 50 ng/mL 800  Barbiturates, urine             Cutoff 200 ng/mL 900  Benzodiazepine, urine           Cutoff 200 ng/mL 1000 Methadone, urine                Cutoff 300 ng/mL 1100 1200 The urine drug screen provides only a preliminary, unconfirmed 1300 analytical test result and should not be used for non-medical 1400 purposes. Clinical consideration and professional judgment should 1500 be applied to any positive drug screen result due to possible 1600 interfering substances. A more specific alternate chemical method 1700 must be used in  order to obtain a confirmed analytical result.  1800 Gas chromato graphy / mass spectrometry (GC/MS) is the preferred 1900 confirmatory method.   Pregnancy, urine POC     Status: None   Collection Time: 11/25/15  4:29 PM  Result Value Ref Range   Preg Test, Ur NEGATIVE NEGATIVE    Comment:        THE SENSITIVITY OF THIS METHODOLOGY IS >24 mIU/mL   Comprehensive metabolic panel     Status: Abnormal  Collection Time: 11/25/15  6:45 PM  Result Value Ref Range   Sodium 141 135 - 145 mmol/L   Potassium 4.0 3.5 - 5.1 mmol/L   Chloride 110 101 - 111 mmol/L   CO2 24 22 - 32 mmol/L   Glucose, Bld 97 65 - 99 mg/dL   BUN 12 6 - 20 mg/dL   Creatinine, Ser 0.59 0.44 - 1.00 mg/dL   Calcium 8.6 (L) 8.9 - 10.3 mg/dL   Total Protein 7.9 6.5 - 8.1 g/dL   Albumin 4.2 3.5 - 5.0 g/dL   AST 14 (L) 15 - 41 U/L   ALT 10 (L) 14 - 54 U/L   Alkaline Phosphatase 50 38 - 126 U/L   Total Bilirubin 0.1 (L) 0.3 - 1.2 mg/dL   GFR calc non Af Amer >60 >60 mL/min   GFR calc Af Amer >60 >60 mL/min    Comment: (NOTE) The eGFR has been calculated using the CKD EPI equation. This calculation has not been validated in all clinical situations. eGFR's persistently <60 mL/min signify possible Chronic Kidney Disease.    Anion gap 7 5 - 15  Ethanol     Status: Abnormal   Collection Time: 11/25/15  6:45 PM  Result Value Ref Range   Alcohol, Ethyl (B) 321 (HH) <5 mg/dL    Comment: CRITICAL RESULT CALLED TO, READ BACK BY AND VERIFIED WITH VANESSA ASHLEIGH AT 1951 ON 11/25/15 BY JUW        LOWEST DETECTABLE LIMIT FOR SERUM ALCOHOL IS 5 mg/dL FOR MEDICAL PURPOSES ONLY   Salicylate level     Status: None   Collection Time: 11/25/15  6:45 PM  Result Value Ref Range   Salicylate Lvl <1.9 2.8 - 30.0 mg/dL  Acetaminophen level     Status: Abnormal   Collection Time: 11/25/15  6:45 PM  Result Value Ref Range   Acetaminophen (Tylenol), Serum <10 (L) 10 - 30 ug/mL    Comment:        THERAPEUTIC CONCENTRATIONS  VARY SIGNIFICANTLY. A RANGE OF 10-30 ug/mL MAY BE AN EFFECTIVE CONCENTRATION FOR MANY PATIENTS. HOWEVER, SOME ARE BEST TREATED AT CONCENTRATIONS OUTSIDE THIS RANGE. ACETAMINOPHEN CONCENTRATIONS >150 ug/mL AT 4 HOURS AFTER INGESTION AND >50 ug/mL AT 12 HOURS AFTER INGESTION ARE OFTEN ASSOCIATED WITH TOXIC REACTIONS.   cbc     Status: Abnormal   Collection Time: 11/25/15  6:45 PM  Result Value Ref Range   WBC 4.8 3.6 - 11.0 K/uL   RBC 4.42 3.80 - 5.20 MIL/uL   Hemoglobin 9.9 (L) 12.0 - 16.0 g/dL   HCT 31.0 (L) 35.0 - 47.0 %   MCV 70.1 (L) 80.0 - 100.0 fL   MCH 22.3 (L) 26.0 - 34.0 pg   MCHC 31.8 (L) 32.0 - 36.0 g/dL   RDW 18.2 (H) 11.5 - 14.5 %   Platelets 310 150 - 440 K/uL    No current facility-administered medications for this encounter.    Current Outpatient Prescriptions  Medication Sig Dispense Refill  . ibuprofen (ADVIL,MOTRIN) 200 MG tablet Take 400 mg by mouth every 6 (six) hours as needed for headache or mild pain.      Musculoskeletal: Strength & Muscle Tone: within normal limits Gait & Station: normal Patient leans: N/A  Psychiatric Specialty Exam: Physical Exam  Nursing note and vitals reviewed. Constitutional: She appears well-developed and well-nourished.  HENT:  Head: Normocephalic and atraumatic.  Eyes: Conjunctivae are normal. Pupils are equal, round, and reactive to light.  Neck: Normal range of  motion.  Cardiovascular: Regular rhythm and normal heart sounds.   Respiratory: Effort normal. No respiratory distress.  GI: Soft.  Musculoskeletal: Normal range of motion.  Neurological: She is alert.  Skin: Skin is warm and dry.     Psychiatric: Her speech is normal and behavior is normal. Her mood appears anxious. Thought content is not paranoid and not delusional. She expresses impulsivity. She expresses no suicidal ideation. She exhibits abnormal recent memory.    Review of Systems  Constitutional: Negative.   HENT: Negative.   Eyes:  Negative.   Respiratory: Negative.   Cardiovascular: Negative.   Gastrointestinal: Negative.   Musculoskeletal: Negative.   Skin: Negative.   Neurological: Negative.   Psychiatric/Behavioral: Positive for depression, memory loss and substance abuse. Negative for hallucinations and suicidal ideas. The patient is nervous/anxious and has insomnia.     Blood pressure 116/78, pulse (!) 101, temperature 98.1 F (36.7 C), temperature source Oral, resp. rate 16, weight 67.1 kg (148 lb), last menstrual period 11/14/2015, SpO2 99 %, unknown if currently breastfeeding.Body mass index is 23.89 kg/m.  General Appearance: Casual  Eye Contact:  Good  Speech:  Clear and Coherent  Volume:  Normal  Mood:  Dysphoric  Affect:  Constricted  Thought Process:  Goal Directed  Orientation:  Full (Time, Place, and Person)  Thought Content:  Logical  Suicidal Thoughts:  No  Homicidal Thoughts:  No  Memory:  Immediate;   Good Recent;   Fair Remote;   Fair  Judgement:  Fair  Insight:  Fair  Psychomotor Activity:  Decreased  Concentration:  Concentration: Fair  Recall:  [employer] of Knowledge:  Fair  Language:  Fair  Akathisia:  No  Handed:  Right  AIMS (if indicated):     Assets:  Desire for Improvement Financial Resources/Insurance Housing Physical Health Resilience  ADL's:  Intact  Cognition:  WNL  Sleep:        Treatment Plan Summary: Plan 40 year old woman who came in intoxicated. Pulse is slightly elevated but she is not tremulous or agitated no sign of delirium. Patient is currently awake and alert and appropriately interactive. Denies any suicidal thoughts. No evidence that she is acted to harm herself since last week. Patient does not require inpatient level treatment. Spent some time counseling her about the obvious need to stop drinking and get involved with substance abuse treatment. Case reviewed with emergency room physician and TTS. Discontinued IVC. Patient will be referred back to  [facility] for substance abuse treatment.  Disposition: Patient does not meet criteria for psychiatric inpatient admission. Supportive therapy provided about ongoing stressors.  Alethia Berthold, MD 11/25/2015 10:03 PM

## 2015-11-25 NOTE — ED Notes (Addendum)
Pt moved to room 22 for telepsych. Pt moved with no assistance, steady gait. A&O x4.

## 2015-11-25 NOTE — ED Notes (Signed)
Pt came with IVC papers. She reports she doesn't know why someone took them out on her. She denies SI/HI and wants to go home.

## 2015-11-25 NOTE — ED Triage Notes (Signed)
Pt to ed with IVC papers.  Per papers pt has hx of suicidal ideation and attempts.  Pt tearful at triage.  Pt denies SI, pt states she wants to hurt one person today, her current boyfriend.  Pt smells of ETOH.  Pt does have marks on bilat forearm scarring but no new cuts of scratches noted.  Pt crying incessantly at triage.  Pt states "If yall keep me locked up in here my children will get hurt, I only want to protect my children."

## 2015-11-25 NOTE — ED Provider Notes (Addendum)
[facility] Emergency Department Provider Note  ____________________________________________  Time seen: Approximately 7:16 PM  I have reviewed the triage vital signs and the nursing notes.   HISTORY  Chief Complaint Psychiatric Evaluation    HPI Shannon Duffy is a 40 y.o. female who is brought to the ED under involuntary commitment due to "suicidal ideation, self-mutilation, history of suicide attempt.". Patient denies any SI HI or hallucinations. By her account, she was at a friend's house today, where she had been for about one hour, when the friend came home on a break from work. She reports that the friend drank 2 shots of liquor then returned to work. Later in the evening, the friend returned home with police who brought the patient to the ED. She reports no acute symptoms and has no idea why she was brought here today. She does report a history of depression but that she has not taken any antidepressants in 3 or 4 years. No new stressors. Lives in an apartment. Does not live with this friend.,  She does report that she has a history of cutting on for her forearms which is many years ago. No new injuries.     Past Medical History:  Diagnosis Date  . Alcohol abuse   . Panic      Patient Active Problem List   Diagnosis Date Noted  . Self-inflicted laceration of wrist 11/14/2015  . Involuntary commitment 11/14/2015  . Alcohol withdrawal (HCC) 01/07/2015  . Substance induced mood disorder (HCC) 01/07/2015  . PTSD (post-traumatic stress disorder) 01/07/2015  . Alcohol use disorder, moderate, dependence (HCC) 09/21/2014     Past Surgical History:  Procedure Laterality Date  . CESAREAN SECTION       Prior to Admission medications   Medication Sig Start Date End Date Taking? Authorizing Provider  ibuprofen (ADVIL,MOTRIN) 200 MG tablet Take 400 mg by mouth every 6 (six) hours as needed for headache or mild pain.    Historical Provider, MD      Allergies Review of patient's allergies indicates no known allergies.   History reviewed. No pertinent family history.  Social History Social History  Substance Use Topics  . Smoking status: Never Smoker  . Smokeless tobacco: Never Used  . Alcohol use Yes     Comment: 6 40 oz beers / day    Review of Systems  Constitutional:   No fever or chills.  ENT:   No sore throat. No rhinorrhea. Cardiovascular:   No chest pain. Respiratory:   No dyspnea or cough. Gastrointestinal:   Negative for abdominal pain, vomiting and diarrhea.  Genitourinary:   Negative for dysuria or difficulty urinating. Musculoskeletal:   Negative for focal pain or swelling Neurological:   Negative for headaches 10-point ROS otherwise negative.  ____________________________________________   PHYSICAL EXAM:  VITAL SIGNS: ED Triage Vitals [11/25/15 1606]  Enc Vitals Group     BP (!) 152/107     Pulse Rate (!) 127     Resp (!) 22     Temp 98.1 F (36.7 C)     Temp Source Oral     SpO2 100 %     Weight 148 lb (67.1 kg)     Height      Head Circumference      Peak Flow      Pain Score 0     Pain Loc      Pain Edu?      Excl. in GC?     Vital  signs reviewed, nursing assessments reviewed.   Constitutional:   Alert and oriented. Well appearing and in no distress. Eyes:   No scleral icterus. No conjunctival pallor. PERRL. EOMI.  No nystagmus. ENT   Head:   Normocephalic and atraumatic.   Nose:   No congestion/rhinnorhea. No septal hematoma   Mouth/Throat:   MMM, no pharyngeal erythema. No peritonsillar mass.    Neck:   No stridor. No SubQ emphysema. No meningismus. Hematological/Lymphatic/Immunilogical:   No cervical lymphadenopathy. Cardiovascular:   RRR. Symmetric bilateral radial and DP pulses.  No murmurs.  Respiratory:   Normal respiratory effort without tachypnea nor retractions. Breath sounds are clear and equal bilaterally. No wheezes/rales/rhonchi. Gastrointestinal:    Soft and nontender. Non distended. There is no CVA tenderness.  No rebound, rigidity, or guarding. Genitourinary:   deferred Musculoskeletal:   Nontender with normal range of motion in all extremities. No joint effusions.  No lower extremity tenderness.  No edema. Neurologic:   Normal speech and language.  CN 2-10 normal. Motor grossly intact. No gross focal neurologic deficits are appreciated.  Skin:    Skin is warm, dry and intact. No rash noted.  No petechiae, purpura, or bullae. Psych:, Cooperative. Pleasant affect. Mood is good. No SI HI or hallucinations. Linear thought. Appropriate responses and interactions.  ____________________________________________    LABS (pertinent positives/negatives) (all labs ordered are listed, but only abnormal results are displayed) Labs Reviewed  COMPREHENSIVE METABOLIC PANEL - Abnormal; Notable for the following:       Result Value   Calcium 8.6 (*)    AST 14 (*)    ALT 10 (*)    Total Bilirubin 0.1 (*)    All other components within normal limits  ETHANOL - Abnormal; Notable for the following:    Alcohol, Ethyl (B) 321 (*)    All other components within normal limits  ACETAMINOPHEN LEVEL - Abnormal; Notable for the following:    Acetaminophen (Tylenol), Serum <10 (*)    All other components within normal limits  CBC - Abnormal; Notable for the following:    Hemoglobin 9.9 (*)    HCT 31.0 (*)    MCV 70.1 (*)    MCH 22.3 (*)    MCHC 31.8 (*)    RDW 18.2 (*)    All other components within normal limits  SALICYLATE LEVEL  URINE DRUG SCREEN, QUALITATIVE ([facility] ONLY)  POCT PREGNANCY, URINE   ____________________________________________   EKG    ____________________________________________    RADIOLOGY    ____________________________________________   PROCEDURES Procedures  ____________________________________________   INITIAL IMPRESSION / ASSESSMENT AND PLAN / ED COURSE  Pertinent labs & imaging results that were  available during my care of the patient were reviewed by me and considered in my medical decision making (see chart for details).  Patient brought to ED under involuntary commitment. By my evaluation she is medically and psychiatrically stable and actually has no acute complaints. She completely denies any suicidal thoughts at this time. She does not appear to be a danger to herself or others and does not meet any ongoing commitment criteria. I will obtain a psychiatry consult but anticipate that the IVC can be rescinded and that the patient can be discharged home soon.     Clinical Course  Comment By Time  Psychiatry [facility] consult obtained. Case discussed with the consultant. However, our ED psychiatrist is currently still in the hospital, who knows this patient from previous evaluations, and will be able to see her tonight for further  assessment to confirm the other psychiatrists findings. Sharman CheekPhillip Roanne Haye, MD 09/05 2121    ----------------------------------------- 9:57 PM on 11/25/2015 -----------------------------------------  Case discussed with Dr. Toni Amendlapacs after his evaluation in the ED. He does feel that the patient is psychiatrically stable, not a danger to herself or others, and suitable for outpatient follow-up. The patient is agreeable to following up with [facility] as she has been previously encouraged to do for her alcohol abuse.IVC order rescinded by Dr. Toni Amendlapacs ____________________________________________   FINAL CLINICAL IMPRESSION(S) / ED DIAGNOSES  Final diagnoses:  Suicidal ideation  Alcohol abuse       Portions of this note were generated with dragon dictation software. Dictation errors may occur despite best attempts at proofreading.    Sharman CheekPhillip Shaye Elling, MD 11/25/15 2158    Sharman CheekPhillip Terryon Pineiro, MD 11/25/15 2158

## 2015-11-25 NOTE — ED Notes (Signed)
Pt moved back to [facility], post SOC in room 22.

## 2016-04-04 IMAGING — CT CT HEAD W/O CM
2 series · 14 of 30 positions shown, 16 images · non-contrast
Comparison: 02/06/2011.

CLINICAL DATA: Intoxicated.  Right forehead scalp hematoma.

EXAM:
CT HEAD WITHOUT CONTRAST
TECHNIQUE: Contiguous axial images were obtained from the base of the skull
through the vertex without intravenous contrast.

[Series 2: head wo · axial · 0.43mm/px · z∈[-173,-73]mm · 6 of 28 slices shown, 8 images]
[im 4/28  brain]
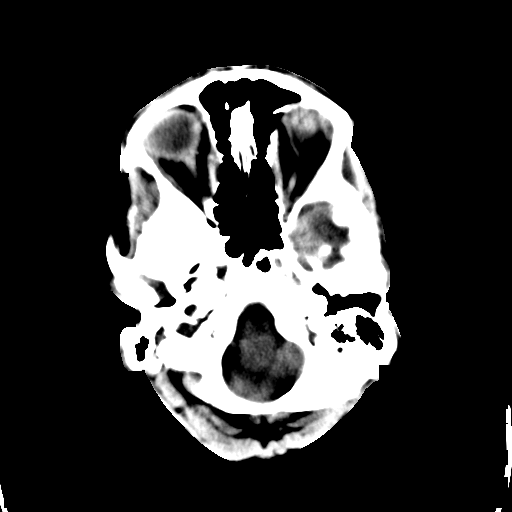
[im 4/28  bone]
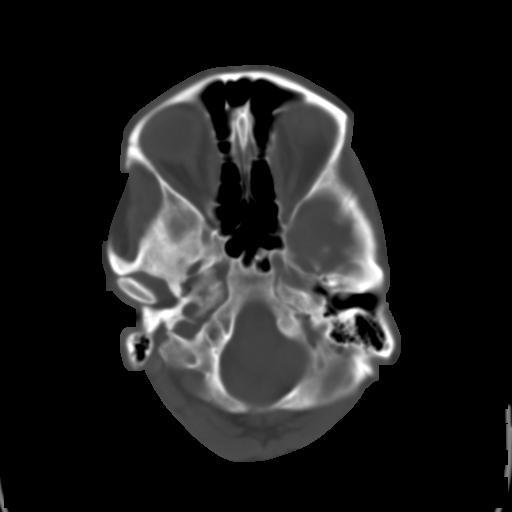
[im 8/28  brain]
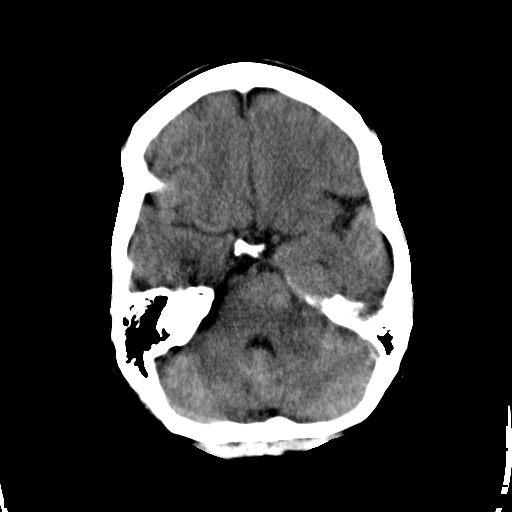
[im 12/28  brain]
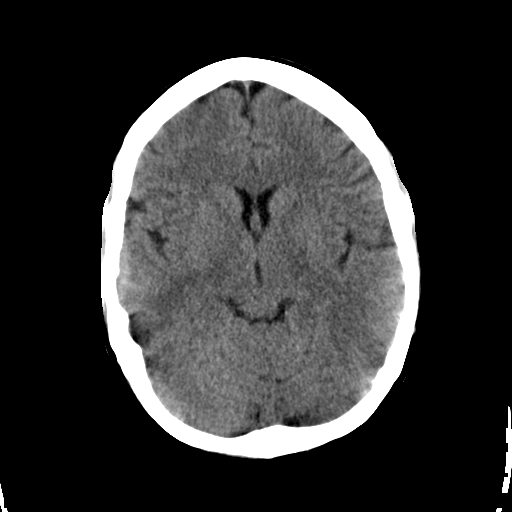
[im 16/28  brain]
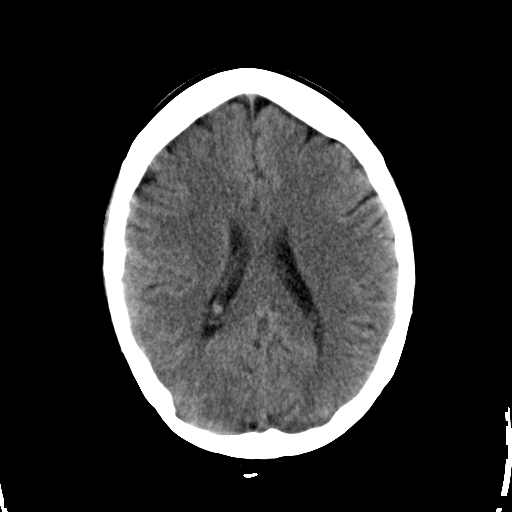
[im 20/28  brain]
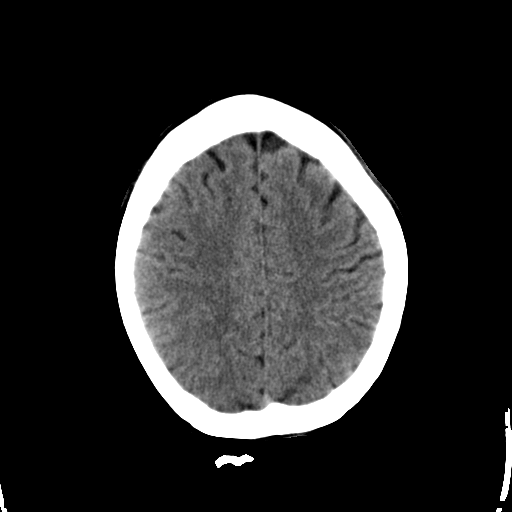
[im 20/28  bone]
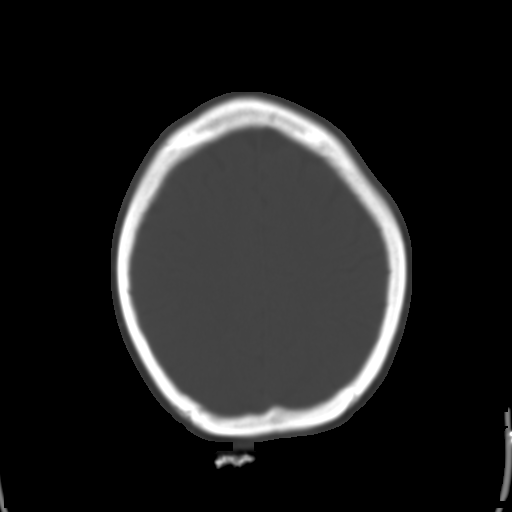
[im 24/28  brain]
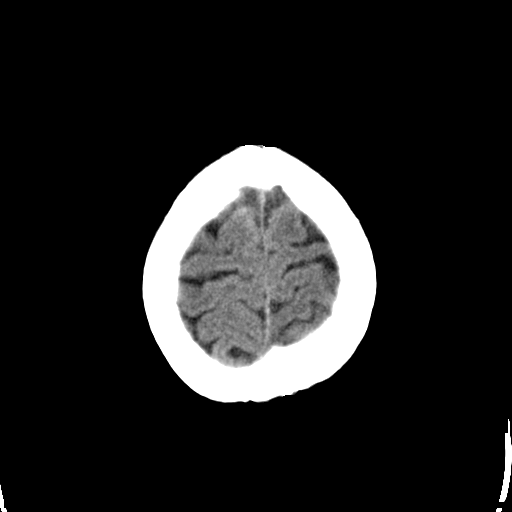

[Series 3: head bone · axial · 0.43mm/px · z∈[-184,-56]mm · 8 of 80 slices shown]
[im 8/80  bone]
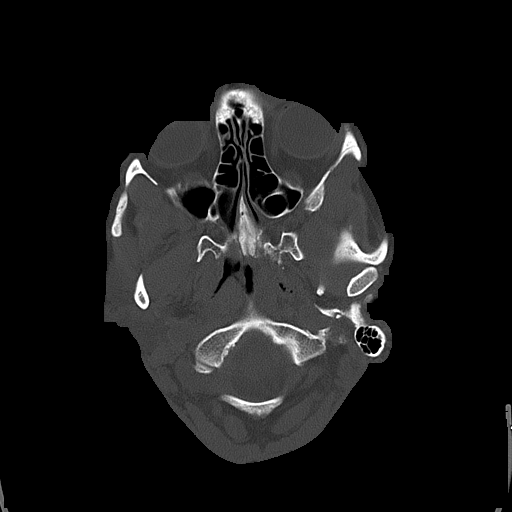
[im 16/80  bone]
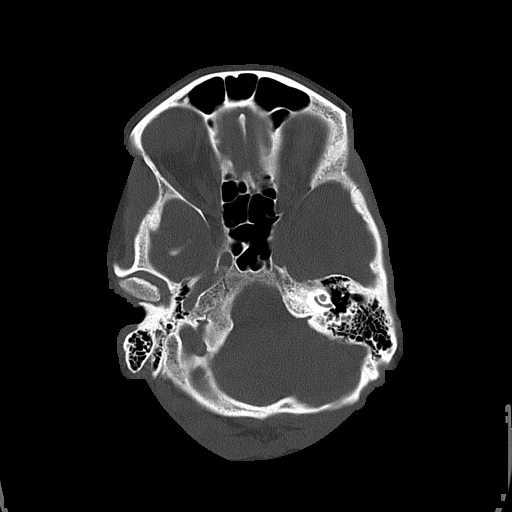
[im 27/80  bone]
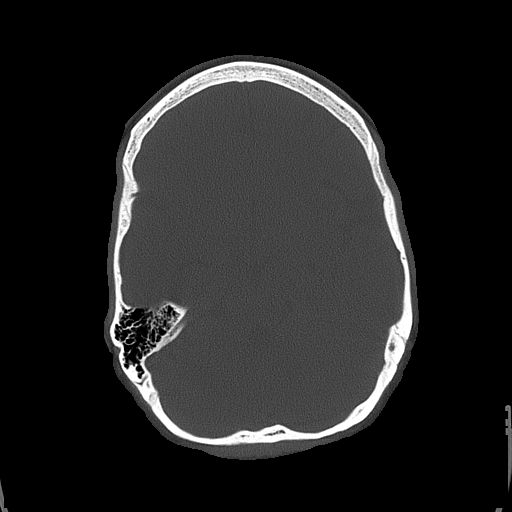
[im 34/80  bone]
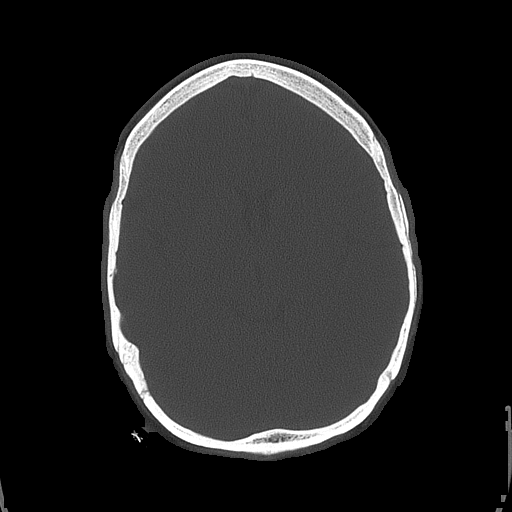
[im 46/80  bone]
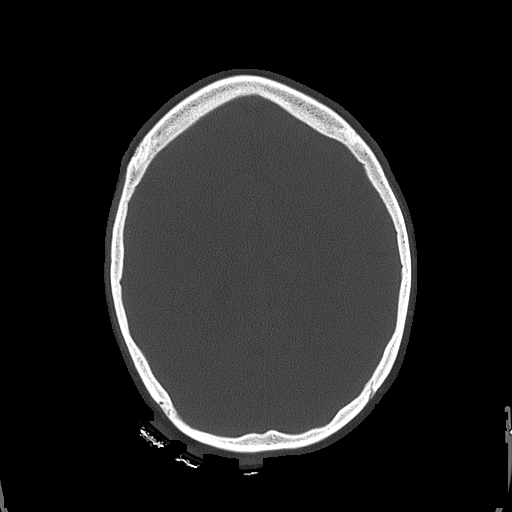
[im 53/80  bone]
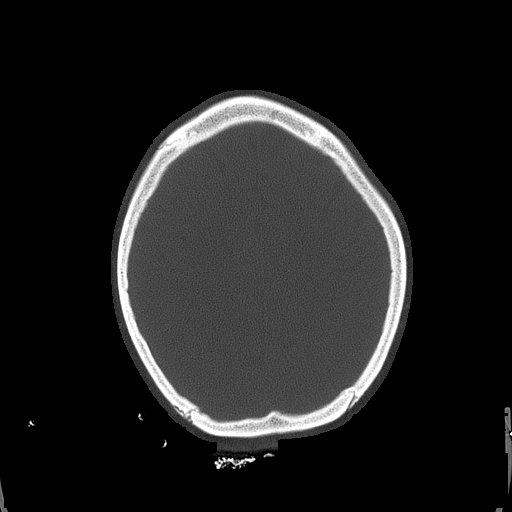
[im 64/80  bone]
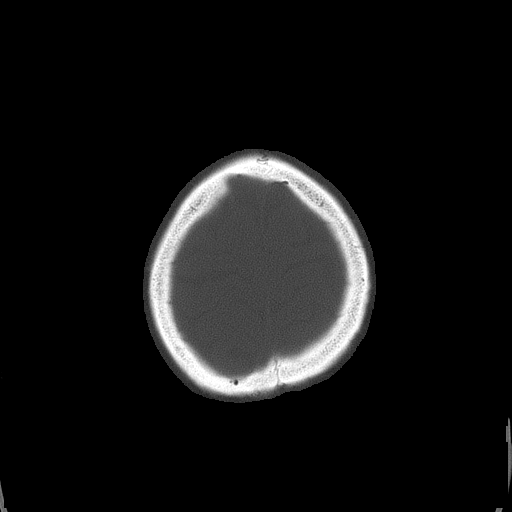
[im 72/80  bone]
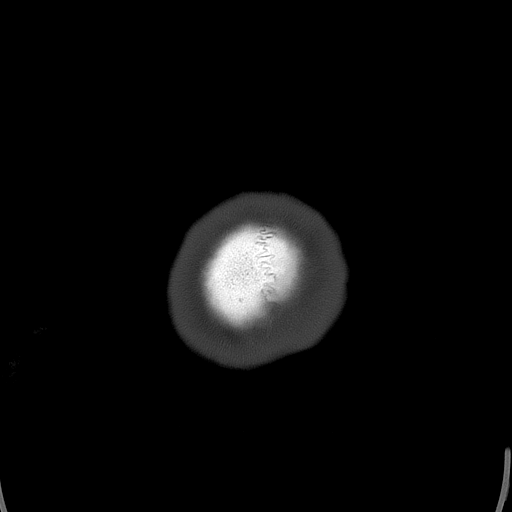

[14 of 30 positions shown; findings below may reference images not displayed]

FINDINGS: There is no intracranial hemorrhage, mass or evidence of acute
infarction. There is no extra-axial fluid collection. Gray matter
and white matter appear normal. Cerebral volume is normal for age.
Brainstem and posterior fossa are unremarkable. The CSF spaces
appear normal.

The bony structures are intact. The visible portions of the
paranasal sinuses are clear.
IMPRESSION: Normal brain

## 2016-06-18 IMAGING — CR DG CHEST 2V
1 series · 2 of 2 positions shown · non-contrast
Comparison: February 06, 2011

CLINICAL DATA: Two day history of chest tightness.

EXAM:
CHEST  2 VIEW

[Series 1: dg chest 2 view · 0.14mm/px · 2 of 2 slices shown]
[im 1/2]
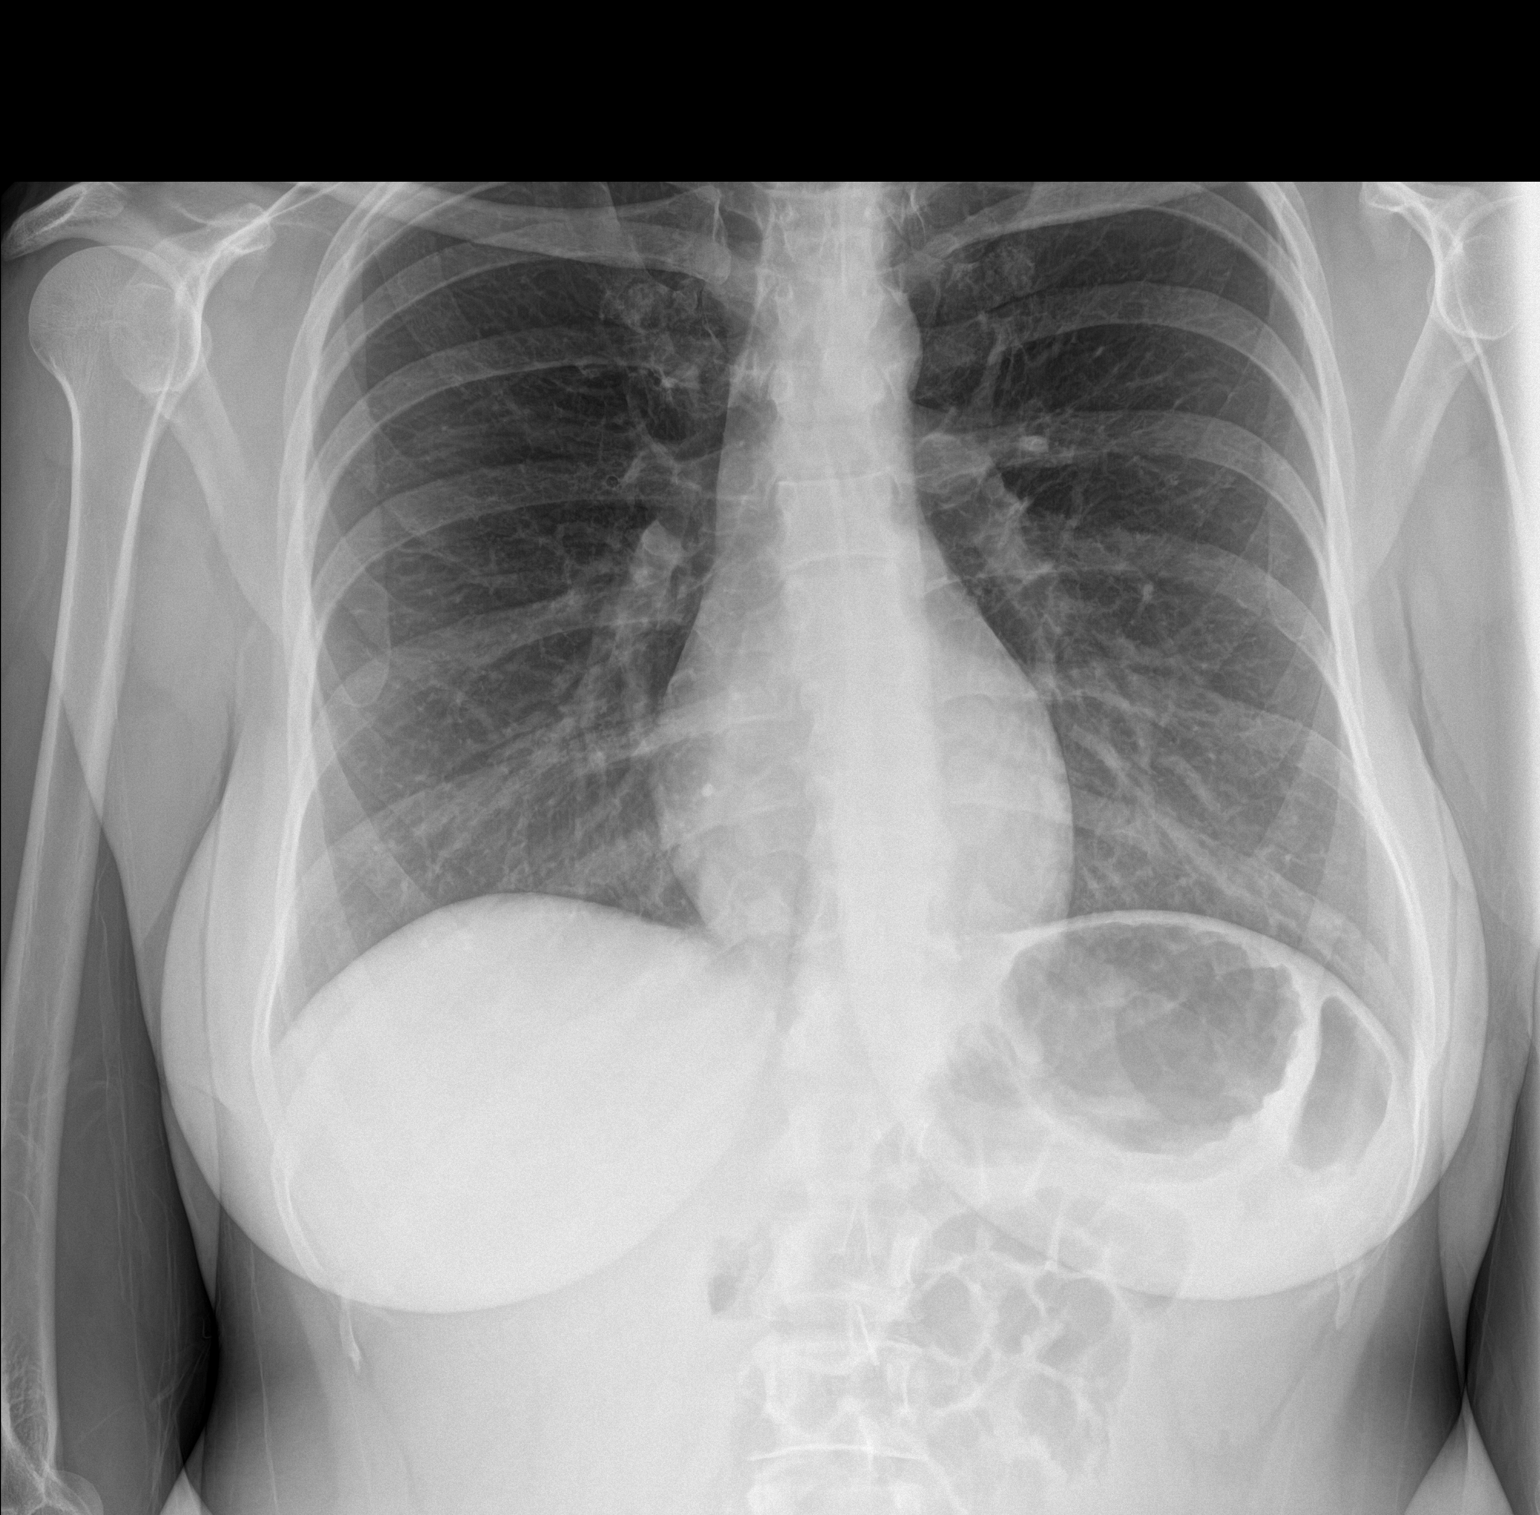
[im 2/2]
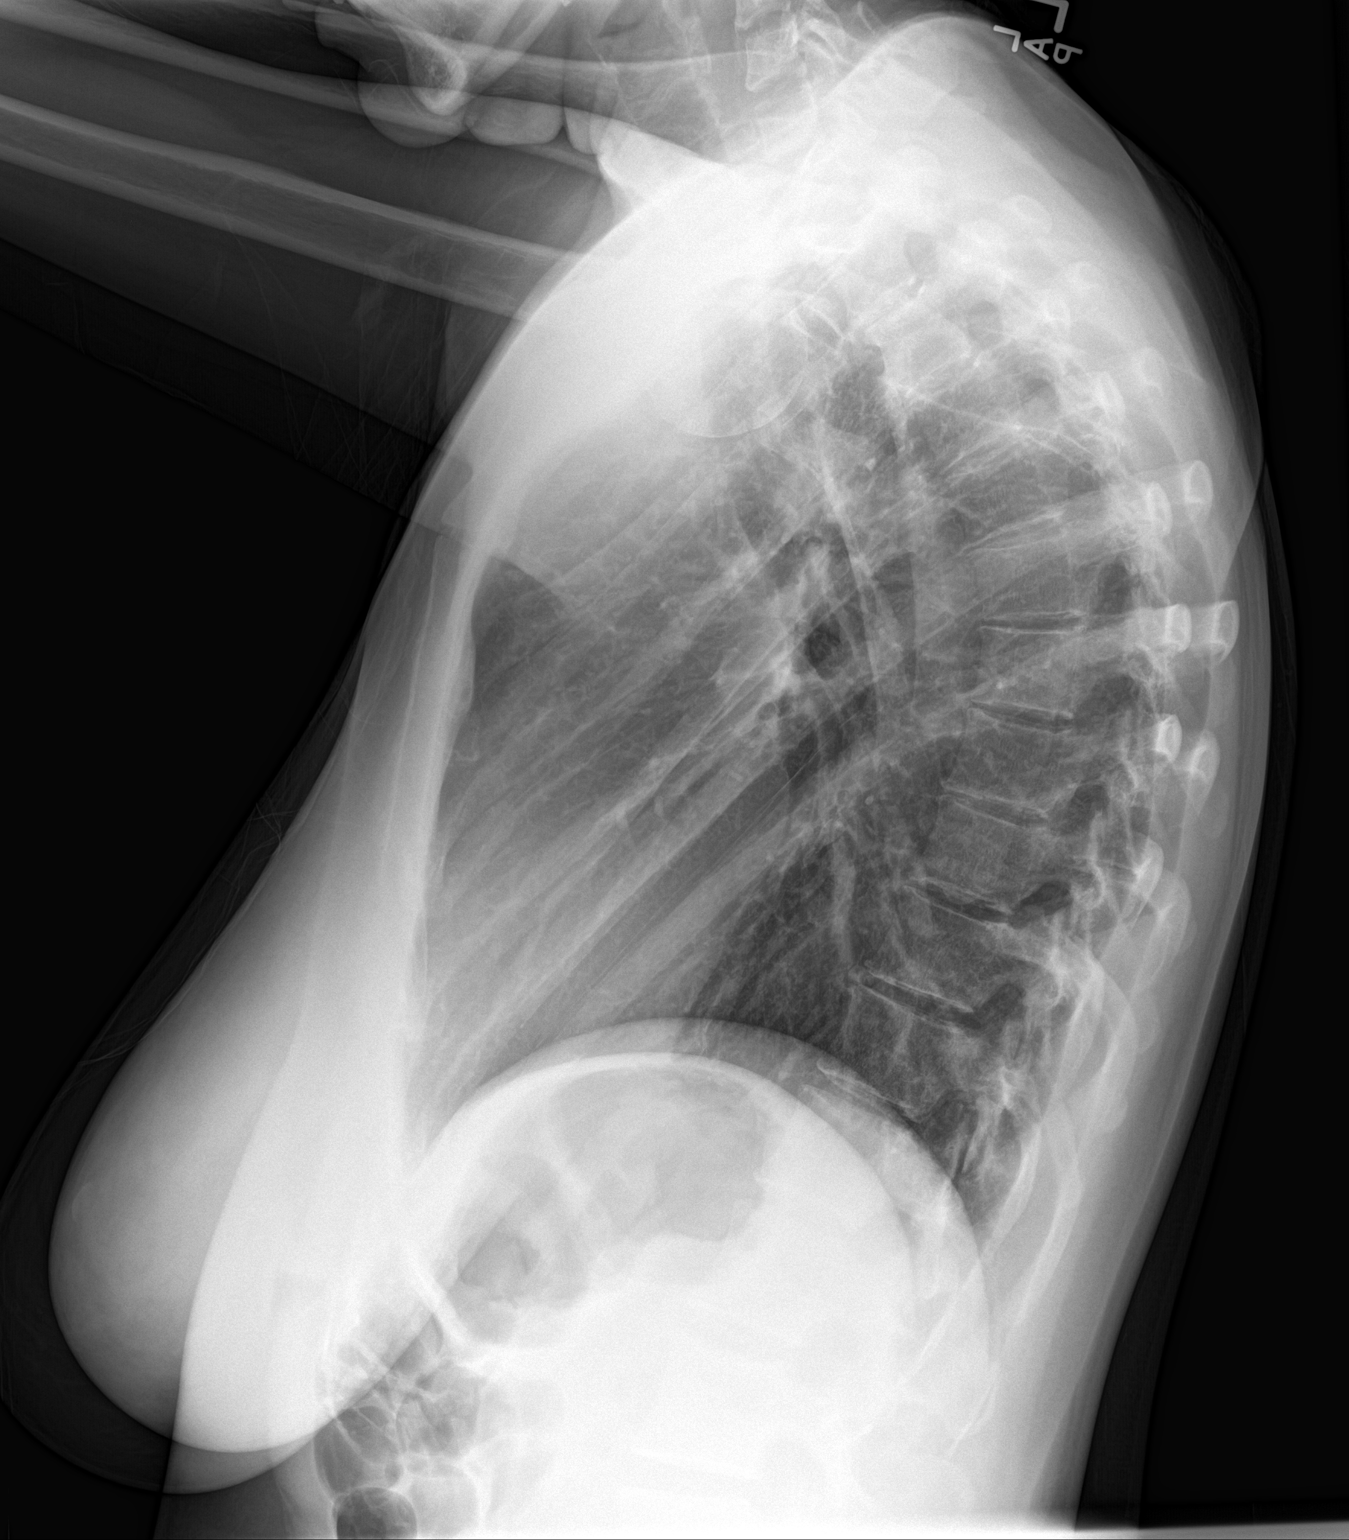

[2 of 2 positions shown; findings below may reference images not displayed]

FINDINGS: Lungs are clear. Heart size and pulmonary vascularity are normal. No
adenopathy. No pneumothorax. There is thoracic levoscoliosis.
IMPRESSION: No edema or consolidation.

## 2019-07-10 ENCOUNTER — Ambulatory Visit: Payer: No Typology Code available for payment source | Attending: Oncology

## 2019-07-31 ENCOUNTER — Ambulatory Visit: Payer: No Typology Code available for payment source

## 2019-08-01 ENCOUNTER — Ambulatory Visit: Payer: Self-pay

## 2022-06-09 ENCOUNTER — Telehealth: Payer: Self-pay

## 2022-06-09 NOTE — Telephone Encounter (Signed)
Mychart msg sent
